# Patient Record
Sex: Female | Born: 1945
Health system: Southern US, Community
[De-identification: ages and names within clinical notes are randomized; demographics above are authoritative.]

## PROBLEM LIST (undated history)

## (undated) DIAGNOSIS — E782 Mixed hyperlipidemia: Secondary | ICD-10-CM

## (undated) DIAGNOSIS — I10 Essential (primary) hypertension: Secondary | ICD-10-CM

## (undated) DIAGNOSIS — I059 Rheumatic mitral valve disease, unspecified: Secondary | ICD-10-CM

## (undated) DIAGNOSIS — F411 Generalized anxiety disorder: Secondary | ICD-10-CM

## (undated) DIAGNOSIS — R079 Chest pain, unspecified: Secondary | ICD-10-CM

## (undated) HISTORY — DX: Mixed hyperlipidemia: E78.2

## (undated) HISTORY — PX: SPINE SURGERY: SHX786

## (undated) HISTORY — DX: Generalized anxiety disorder: F41.1

## (undated) HISTORY — PX: APPENDECTOMY: SHX54

## (undated) HISTORY — DX: Chest pain, unspecified: R07.9

## (undated) HISTORY — PX: TUBAL LIGATION: SHX77

## (undated) HISTORY — DX: Rheumatic mitral valve disease, unspecified: I05.9

## (undated) HISTORY — DX: Essential (primary) hypertension: I10

## (undated) HISTORY — PX: CARDIAC CATHETERIZATION: SHX172

---

## 1997-09-09 ENCOUNTER — Ambulatory Visit: Admission: RE | Admit: 1997-09-09 | Discharge: 1997-09-09 | Payer: Self-pay | Admitting: Cardiology

## 1998-01-30 ENCOUNTER — Other Ambulatory Visit: Admission: RE | Admit: 1998-01-30 | Discharge: 1998-01-30 | Payer: Self-pay | Admitting: Obstetrics & Gynecology

## 1998-08-24 ENCOUNTER — Ambulatory Visit (HOSPITAL_COMMUNITY): Admission: RE | Admit: 1998-08-24 | Discharge: 1998-08-24 | Payer: Self-pay | Admitting: Obstetrics and Gynecology

## 1998-08-24 ENCOUNTER — Encounter (INDEPENDENT_AMBULATORY_CARE_PROVIDER_SITE_OTHER): Payer: Self-pay | Admitting: Specialist

## 2000-05-26 ENCOUNTER — Other Ambulatory Visit: Admission: RE | Admit: 2000-05-26 | Discharge: 2000-05-26 | Payer: Self-pay | Admitting: Obstetrics and Gynecology

## 2002-07-07 ENCOUNTER — Other Ambulatory Visit: Admission: RE | Admit: 2002-07-07 | Discharge: 2002-07-07 | Payer: Self-pay | Admitting: Obstetrics and Gynecology

## 2003-01-28 ENCOUNTER — Encounter (INDEPENDENT_AMBULATORY_CARE_PROVIDER_SITE_OTHER): Payer: Self-pay | Admitting: *Deleted

## 2003-01-28 ENCOUNTER — Ambulatory Visit (HOSPITAL_COMMUNITY): Admission: RE | Admit: 2003-01-28 | Discharge: 2003-01-28 | Payer: Self-pay | Admitting: Obstetrics and Gynecology

## 2003-08-17 ENCOUNTER — Ambulatory Visit (HOSPITAL_COMMUNITY): Admission: RE | Admit: 2003-08-17 | Discharge: 2003-08-17 | Payer: Self-pay | Admitting: Cardiology

## 2003-09-10 ENCOUNTER — Encounter (INDEPENDENT_AMBULATORY_CARE_PROVIDER_SITE_OTHER): Payer: Self-pay | Admitting: Specialist

## 2003-09-10 ENCOUNTER — Inpatient Hospital Stay (HOSPITAL_COMMUNITY): Admission: EM | Admit: 2003-09-10 | Discharge: 2003-09-12 | Payer: Self-pay | Admitting: Emergency Medicine

## 2004-08-15 ENCOUNTER — Encounter: Admission: RE | Admit: 2004-08-15 | Discharge: 2004-08-15 | Payer: Self-pay | Admitting: Endocrinology

## 2004-11-16 IMAGING — CT CT PELVIS W/O CM
1 series · 15 of 32 positions shown, 19 images · non-contrast
Comparison: none

CLINICAL DATA: Right lower quadrant pain.  Question appendicitis.
CT OF THE ABDOMEN AND PELVIS WITHOUT CONTRAST
The present examination was performed on an emergent basis without intravenous or oral contrast.  No comparisons.
CT ABDOMEN:

[Series 2: abd pelvis · axial · 0.85mm/px · z∈[-466,-81]mm · 15 of 86 slices shown, 19 images]
[im 6/86  soft-tissue]
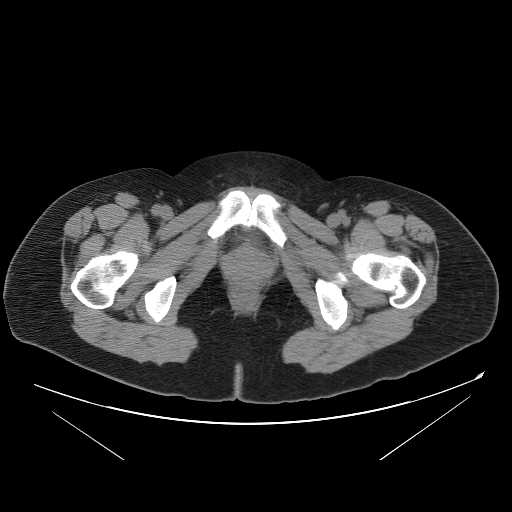
[im 6/86  bone]
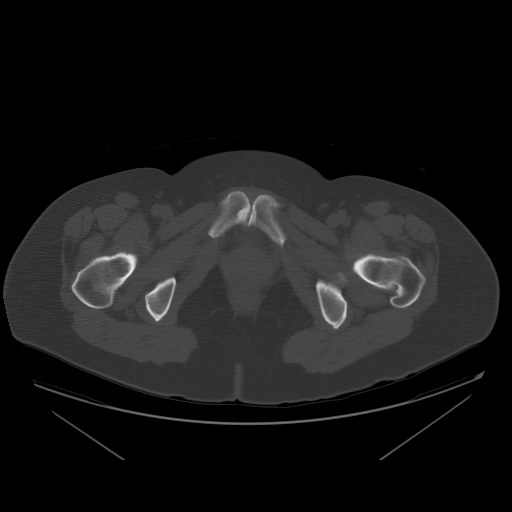
[im 11/86  soft-tissue]
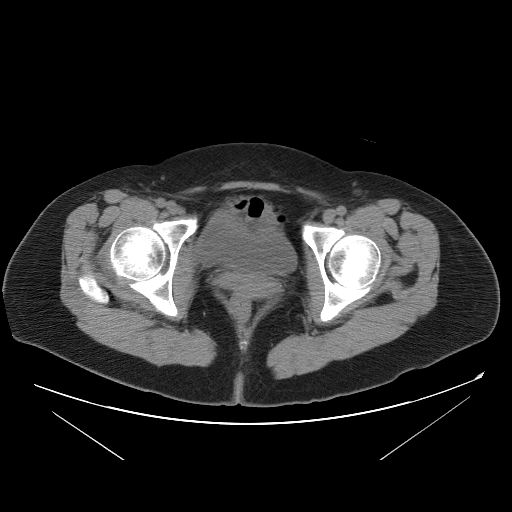
[im 17/86  soft-tissue]
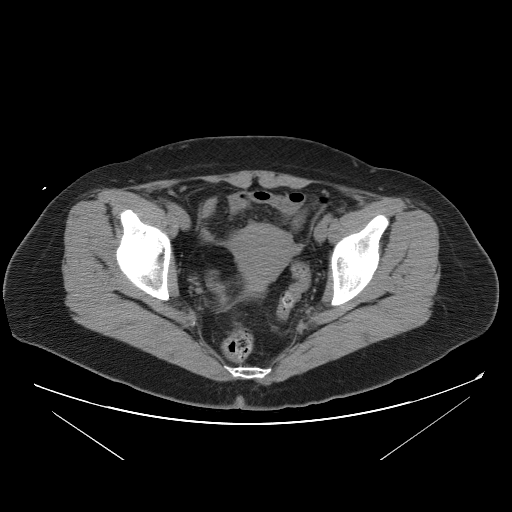
[im 25/86  soft-tissue]
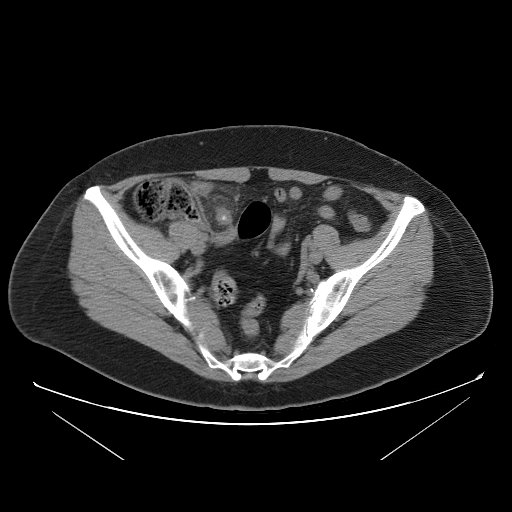
[im 31/86  soft-tissue]
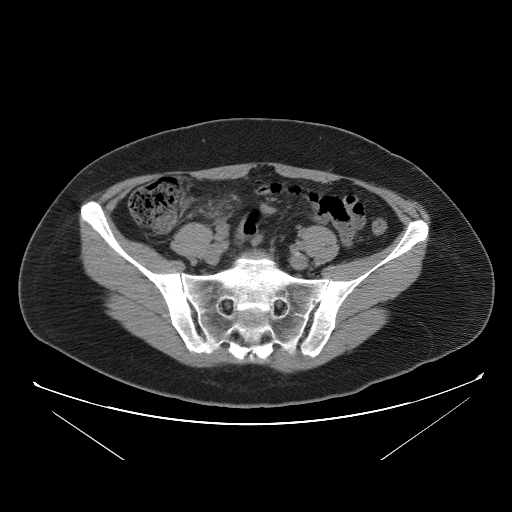
[im 36/86  soft-tissue]
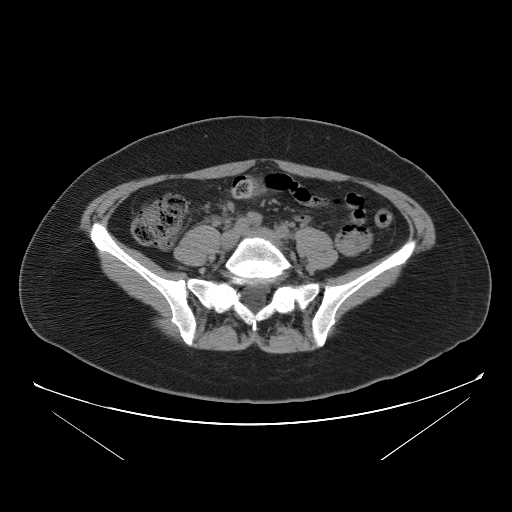
[im 44/86  soft-tissue]
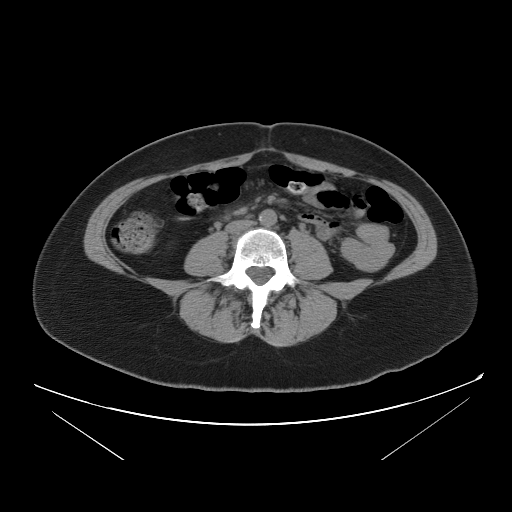
[im 50/86  soft-tissue]
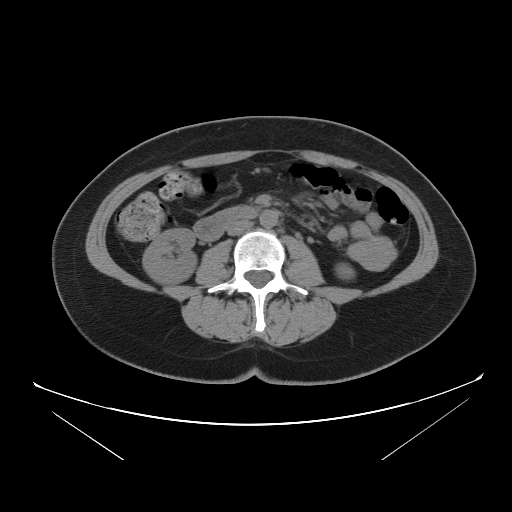
[im 55/86  soft-tissue]
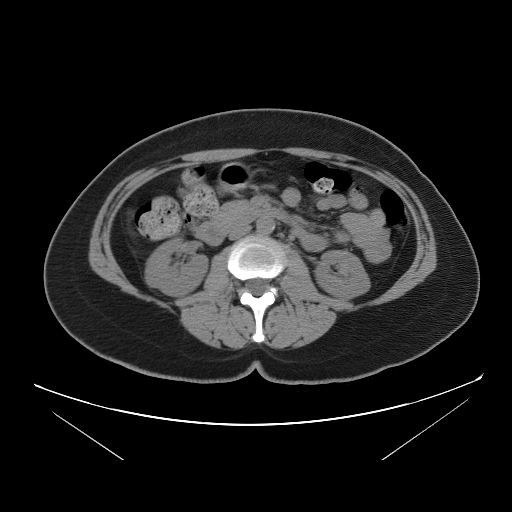
[im 55/86  bone]
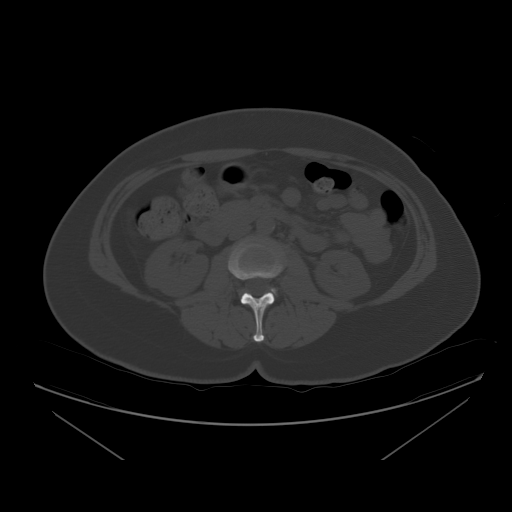
[im 61/86  soft-tissue]
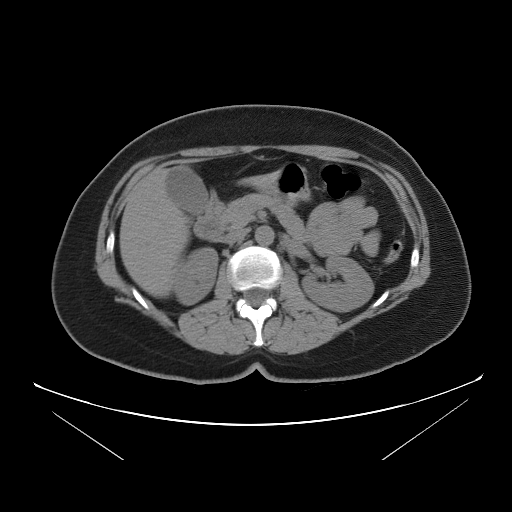
[im 69/86  soft-tissue]
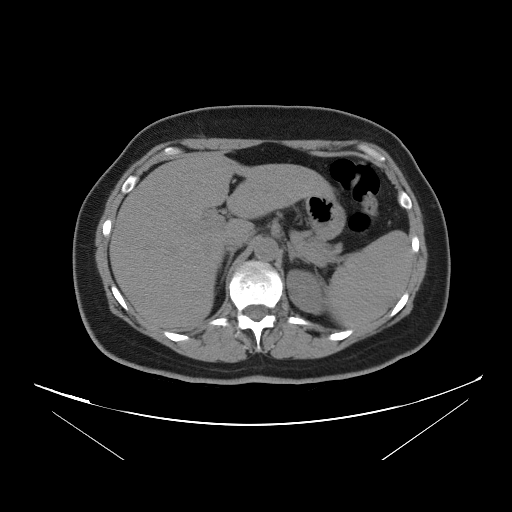
[im 75/86  soft-tissue]
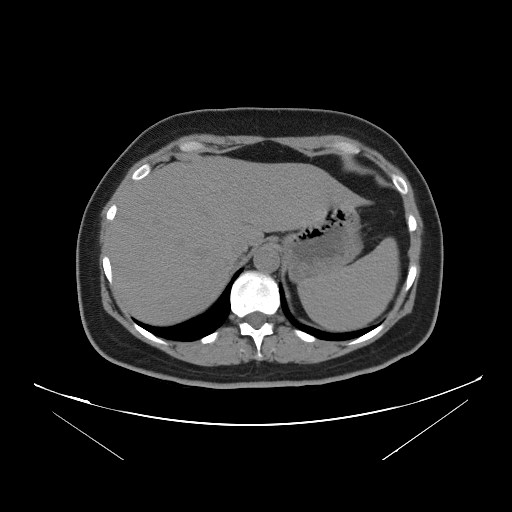
[im 75/86  lung]
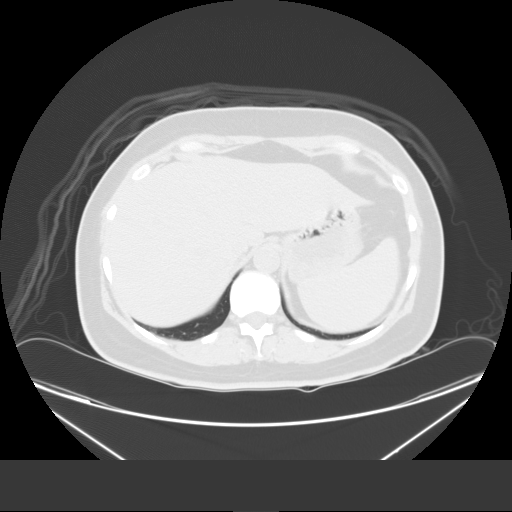
[im 77/86  lung]
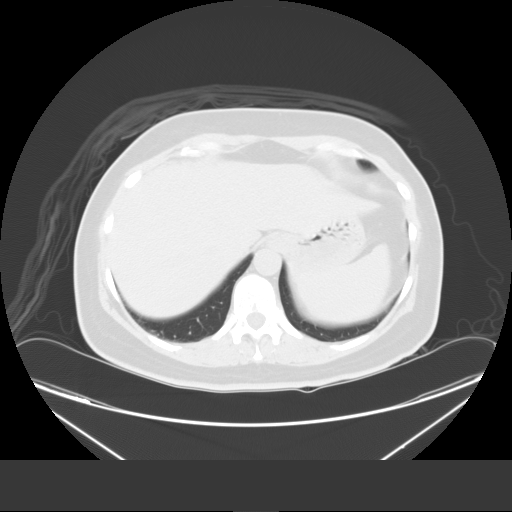
[im 80/86  soft-tissue]
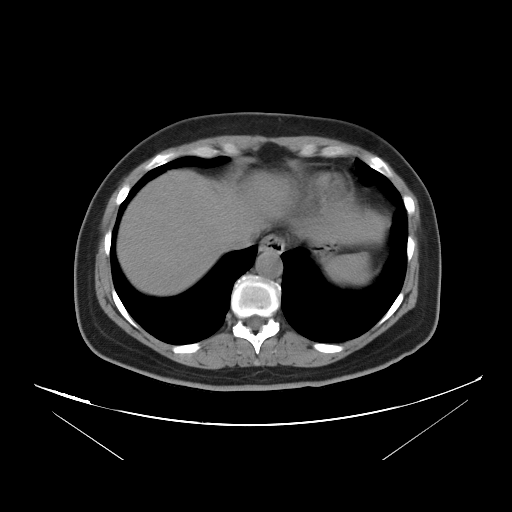
[im 80/86  lung]
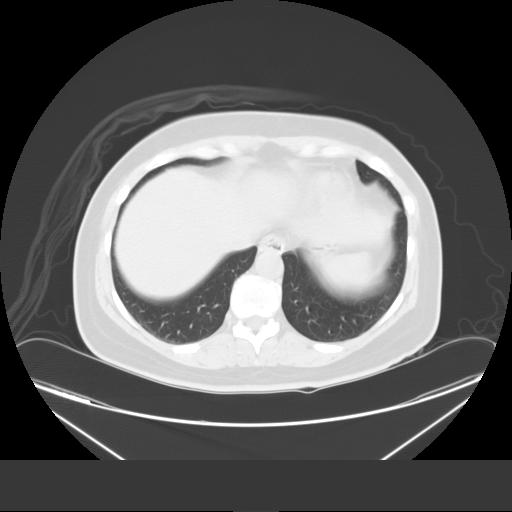
[im 83/86  lung]
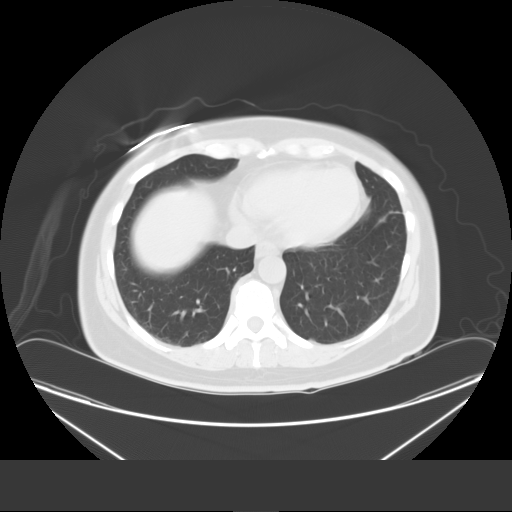

[15 of 32 positions shown; findings below may reference images not displayed]

FINDINGS: The lung bases are clear.  Unenhanced visualized portions of the liver, spleen, kidneys, pancreas, and adrenal glands are unremarkable.  There are mild atherosclerotic type changes of the abdominal aorta with calcifications but without aneurysmal dilatation.  The gallbladder by CT appears unremarkable.  No evidence of upper abdominal abnormal fluid collection or inflammatory process.  Please see below.
IMPRESSION
Mild atherosclerotic type changes of the abdominal aorta without aneurysmal dilatation.
CT PELVIS:
FINDINGS: Enlarged appendix containing an appendicolith with inflammation of surrounding  fat planes is consistent with appendicitis located just posterior to the right rectus muscle.  Degenerative changes of the lower lumbar spine.
IMPRESSION
Findings consistent with appendicitis with appendicolith.

## 2005-08-07 ENCOUNTER — Observation Stay (HOSPITAL_COMMUNITY): Admission: RE | Admit: 2005-08-07 | Discharge: 2005-08-08 | Payer: Self-pay | Admitting: Neurosurgery

## 2005-12-10 ENCOUNTER — Other Ambulatory Visit: Admission: RE | Admit: 2005-12-10 | Discharge: 2005-12-10 | Payer: Self-pay | Admitting: Obstetrics and Gynecology

## 2006-06-03 ENCOUNTER — Encounter: Admission: RE | Admit: 2006-06-03 | Discharge: 2006-06-03 | Payer: Self-pay | Admitting: Endocrinology

## 2007-10-08 ENCOUNTER — Ambulatory Visit (HOSPITAL_COMMUNITY): Admission: RE | Admit: 2007-10-08 | Discharge: 2007-10-08 | Payer: Self-pay | Admitting: Obstetrics and Gynecology

## 2008-01-08 ENCOUNTER — Ambulatory Visit (HOSPITAL_COMMUNITY): Admission: RE | Admit: 2008-01-08 | Discharge: 2008-01-08 | Payer: Self-pay | Admitting: *Deleted

## 2008-07-01 ENCOUNTER — Inpatient Hospital Stay (HOSPITAL_COMMUNITY): Admission: EM | Admit: 2008-07-01 | Discharge: 2008-07-02 | Payer: Self-pay | Admitting: Emergency Medicine

## 2008-07-01 ENCOUNTER — Ambulatory Visit: Payer: Self-pay | Admitting: Cardiovascular Disease

## 2008-07-06 DIAGNOSIS — I059 Rheumatic mitral valve disease, unspecified: Secondary | ICD-10-CM | POA: Insufficient documentation

## 2008-07-06 DIAGNOSIS — I10 Essential (primary) hypertension: Secondary | ICD-10-CM | POA: Insufficient documentation

## 2008-07-06 DIAGNOSIS — R079 Chest pain, unspecified: Secondary | ICD-10-CM | POA: Insufficient documentation

## 2008-07-06 DIAGNOSIS — E782 Mixed hyperlipidemia: Secondary | ICD-10-CM | POA: Insufficient documentation

## 2008-07-06 DIAGNOSIS — F411 Generalized anxiety disorder: Secondary | ICD-10-CM | POA: Insufficient documentation

## 2008-07-07 ENCOUNTER — Ambulatory Visit: Payer: Self-pay | Admitting: Cardiovascular Disease

## 2008-07-14 ENCOUNTER — Ambulatory Visit: Payer: Self-pay | Admitting: Cardiology

## 2008-07-14 ENCOUNTER — Inpatient Hospital Stay (HOSPITAL_COMMUNITY): Admission: EM | Admit: 2008-07-14 | Discharge: 2008-07-16 | Payer: Self-pay | Admitting: Emergency Medicine

## 2008-07-15 ENCOUNTER — Encounter: Payer: Self-pay | Admitting: Cardiology

## 2010-04-02 ENCOUNTER — Encounter: Payer: Self-pay | Admitting: Emergency Medicine

## 2010-04-10 NOTE — Letter (Signed)
Summary: [facility]  [facility]   Imported By: Kassie Mends 10/10/2008 08:42:48  _____________________________________________________________________  External Attachment:    Type:   Image     Comment:   External Document

## 2010-04-10 NOTE — Assessment & Plan Note (Signed)
Summary: [facility]   CC:  pt complians of chest pains which radiated from chest to back to jaw pt states when she tryes to caught her breath it hurts more.  History of Present Illness: [name] is seen today at the request of [facility] and Dr. Juleen China he was seen April 14 in the ER and stayed overnight for chest pain.  Had 2 previous episodes in 2006 and 2008.  She gets a spasm in the epigastric and chest area.  It radiates up towards her jaw.  It can last 15-20 minutes.  The most recent episode lasted one to 2 hours.  She was under a lot of stress at work.  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  I reviewed her hospital records.  She ruled out for myocardial infarction and no abnormalities on telemetry.  Blood work was normal and EKG was normal.  She was discharged with cardiology followup.  She has not had any recurrent chest pain.  She has previously been seen by Dr. Aggie Cosier.  She has not had any neck or stress test in quite some time.  She has a long-standing history of murmur and MVP.  He's not had any significant palpitations PND or thought me.  He does get dyspnea going upstairs but not on flat ground.  She has had some lower extremity cramping and some pain in her arches but this does not sound like claudication.  She is a headaches and palpitations syncope or diaphoresis.  Current Problems (verified): 1)  Essential Hypertension, Benign  (ICD-401.1) 2)  Mixed Hyperlipidemia  (ICD-272.2) 3)  Anxiety State, Unspecified  (ICD-300.00) 4)  Mitral Valve Prolapse  (ICD-424.0) 5)  Chest Pain Unspecified  (ICD-786.50)  Current Medications (verified): 1)  Omega 3 .Marland Kitchen.. 1 Tab By Mouth Once Daily 2)  Vitamin C 1000mg  .... 1 Tab By Mouth Once Daily 3)  Centrum Silver .Marland Kitchen.. 1 Tab By Mouth Once Daily 4)  Ativan .... As Needed 5)  Aspirin 81 Mg Tbec (Aspirin) .... Take One Tablet By Mouth Daily 6)  Exforge 5mg -160mg  .... Take One Tablet By Mouth  Daily 7)  Metoprolol Succinate 50 Mg Xr24h-Tab (Metoprolol Succinate) .... 1/2 Tab By Mouth Once Daily 8)  Crestor 20 Mg Tabs (Rosuvastatin Calcium) .... 1/2 Tab Po Once Daily  Allergies (verified): 1)  ! * Mycin 2)  ! Codeine  Past History:  Past Medical History:     1. Hypertension.      2. Hyperlipidemia.      3. Mitral valve prolapse.      4. History of bilateral tubal ligation.      5. History of acute appendicitis treated with laparoscopic          appendectomy.      6. History of cervical spine surgery in 2007.     7. Chest Pain 4/23 [facility] admission R/O outpatient stress echo (07/06/2008)  Family History:     Mother died in his 87s of coronary artery disease.      Father died in his 33s of an MI.  Brother died in his late 37s after      having had aortic valve replacement, bypass surgery and a pacemaker      placed.  (07/06/2008)  Social History:     [place].      She is employed with a [employer] in [place].  She is a  nonsmoker, nondrinker and has moderate to heavy caffeine intake in the      form of sodas.  (07/06/2008)  Review of Systems       Denies fever, malais, weight loss, blurry vision, decreased visual acuity, cough, sputum, SOB, hemoptysis, pleuritic pain, palpitaitons, heartburn, abdominal pain, melena, lower extremity edema, claudication, or rash. Mild pain in arches  Vital Signs:  Patient profile:   65 year old female Weight:      175 pounds Pulse rate:   55 / minute Resp:     12 per minute BP sitting:   133 / 80  (left arm)  Vitals Entered By: Kem Parkinson (July 07, 2008 2:23 PM)  Physical Exam  General:  Affect appropriate Healthy:  appears stated age HEENT: normal Neck supple with no adenopathy JVP normal no bruits no thyromegaly Lungs clear with no wheezing and good diaphragmatic motion Heart:  S1/S2 soft SEM murmur,rub, gallop or click PMI normal Abdomen: benighn, BS  positve, no tenderness, no AAA no bruit.  No HSM or HJR Distal pulses intact with no bruits No edema Neuro non-focal Skin warm and dry    Impression & Recommendations:  Problem # 1:  CHEST PAIN UNSPECIFIED (ICD-786.50) More atypical.  ECG normal F/U stress echo Her updated medication list for this problem includes:    Aspirin 81 Mg Tbec (Aspirin) .Marland Kitchen... Take one tablet by mouth daily    Metoprolol Succinate 50 Mg Xr24h-tab (Metoprolol succinate) .Marland Kitchen... 1/2 tab by mouth once daily  Orders: Stress Echo (Stress Echo)  Problem # 2:  MITRAL VALVE PROLAPSE (ICD-424.0) Murmur sounds like aortic sclerosis.  Will see valves with stress echo.  Doubt significant MVP No need for SBE prophylaixis Her updated medication list for this problem includes:    Metoprolol Succinate 50 Mg Xr24h-tab (Metoprolol succinate) .Marland Kitchen... 1/2 tab by mouth once daily  Orders: EKG w/ Interpretation (93000)  Problem # 3:  ESSENTIAL HYPERTENSION, BENIGN (ICD-401.1) Well controlled low sodium diet Her updated medication list for this problem includes:    Aspirin 81 Mg Tbec (Aspirin) .Marland Kitchen... Take one tablet by mouth daily    Metoprolol Succinate 50 Mg Xr24h-tab (Metoprolol succinate) .Marland Kitchen... 1/2 tab by mouth once daily  Problem # 4:  MIXED HYPERLIPIDEMIA (ICD-272.2) Continue statin lipid and liver per Dr/ Juleen China Her updated medication list for this problem includes:    Crestor 20 Mg Tabs (Rosuvastatin calcium) .Marland Kitchen... 1/2 tab po once daily  Patient Instructions: 1)  Your physician recommends that you schedule a follow-up appointment in: as needed 2)  Your physician has requested that you have a stress echocardiogram. For further information please visit [email or web address]  Please follow instruction sheet as given.

## 2010-05-01 ENCOUNTER — Emergency Department (HOSPITAL_COMMUNITY)
Admission: EM | Admit: 2010-05-01 | Discharge: 2010-05-02 | Disposition: A | Discharge status: Home / Self Care | Payer: BC Managed Care – PPO | Attending: Emergency Medicine | Admitting: Emergency Medicine

## 2010-05-01 ENCOUNTER — Emergency Department (HOSPITAL_COMMUNITY): Payer: BC Managed Care – PPO

## 2010-05-01 DIAGNOSIS — R0789 Other chest pain: Secondary | ICD-10-CM | POA: Insufficient documentation

## 2010-05-01 DIAGNOSIS — Z981 Arthrodesis status: Secondary | ICD-10-CM | POA: Insufficient documentation

## 2010-05-01 DIAGNOSIS — I498 Other specified cardiac arrhythmias: Secondary | ICD-10-CM | POA: Insufficient documentation

## 2010-05-01 DIAGNOSIS — M47812 Spondylosis without myelopathy or radiculopathy, cervical region: Secondary | ICD-10-CM | POA: Insufficient documentation

## 2010-05-01 DIAGNOSIS — E7800 Pure hypercholesterolemia, unspecified: Secondary | ICD-10-CM | POA: Insufficient documentation

## 2010-05-01 DIAGNOSIS — M542 Cervicalgia: Secondary | ICD-10-CM | POA: Insufficient documentation

## 2010-05-01 DIAGNOSIS — I059 Rheumatic mitral valve disease, unspecified: Secondary | ICD-10-CM | POA: Insufficient documentation

## 2010-05-01 LAB — DIFFERENTIAL
Basophils Absolute: 0 10*3/uL (ref 0.0–0.1)
Basophils Relative: 0 % (ref 0–1)
Eosinophils Absolute: 0.3 10*3/uL (ref 0.0–0.7)
Eosinophils Relative: 3 % (ref 0–5)
Lymphocytes Relative: 28 % (ref 12–46)
Lymphs Abs: 2.7 10*3/uL (ref 0.7–4.0)
Monocytes Absolute: 0.6 10*3/uL (ref 0.1–1.0)
Monocytes Relative: 7 % (ref 3–12)
Neutro Abs: 5.8 10*3/uL (ref 1.7–7.7)
Neutrophils Relative %: 62 % (ref 43–77)

## 2010-05-01 LAB — COMPREHENSIVE METABOLIC PANEL
ALT: 17 U/L (ref 0–35)
AST: 23 U/L (ref 0–37)
Albumin: 4.7 g/dL (ref 3.5–5.2)
Alkaline Phosphatase: 81 U/L (ref 39–117)
BUN: 8 mg/dL (ref 6–23)
CO2: 30 mEq/L (ref 19–32)
Calcium: 9.7 mg/dL (ref 8.4–10.5)
Chloride: 107 mEq/L (ref 96–112)
Creatinine, Ser: 0.71 mg/dL (ref 0.4–1.2)
GFR calc Af Amer: >60 mL/min (ref >60)
GFR calc non Af Amer: >60 mL/min (ref >60)
Glucose, Bld: 94 mg/dL (ref 70–99)
Potassium: 3.6 mEq/L (ref 3.5–5.1)
Sodium: 146 mEq/L — ABNORMAL HIGH (ref 135–145)
Total Bilirubin: 0.6 mg/dL (ref 0.3–1.2)
Total Protein: 7.5 g/dL (ref 6.0–8.3)

## 2010-05-01 LAB — CBC
HCT: 42.4 % (ref 36.0–46.0)
Hemoglobin: 14.4 g/dL (ref 12.0–15.0)
MCH: 30.2 pg (ref 26.0–34.0)
MCHC: 34 g/dL (ref 30.0–36.0)
MCV: 88.9 fL (ref 78.0–100.0)
Platelets: 223 10*3/uL (ref 150–400)
RBC: 4.77 MIL/uL (ref 3.87–5.11)
RDW: 13 % (ref 11.5–15.5)
WBC: 9.4 10*3/uL (ref 4.0–10.5)

## 2010-05-01 LAB — POCT CARDIAC MARKERS
CKMB, poc: 1.2 ng/mL (ref 1.0–8.0)
CKMB, poc: <1 ng/mL — ABNORMAL LOW (ref 1.0–8.0)
Myoglobin, poc: 59.2 ng/mL (ref 12–200)
Myoglobin, poc: 72.8 ng/mL (ref 12–200)
Troponin i, poc: 0.09 ng/mL (ref 0.00–0.09)
Troponin i, poc: <0.05 ng/mL (ref 0.00–0.09)

## 2010-06-19 LAB — COMPREHENSIVE METABOLIC PANEL
ALT: 24 U/L (ref 0–35)
AST: 29 U/L (ref 0–37)
Albumin: 4.3 g/dL (ref 3.5–5.2)
Alkaline Phosphatase: 95 U/L (ref 39–117)
BUN: 10 mg/dL (ref 6–23)
CO2: 25 mEq/L (ref 19–32)
Calcium: 9.3 mg/dL (ref 8.4–10.5)
Chloride: 105 mEq/L (ref 96–112)
Creatinine, Ser: 0.56 mg/dL (ref 0.4–1.2)
GFR calc Af Amer: >60 mL/min (ref >60)
GFR calc non Af Amer: >60 mL/min (ref >60)
Glucose, Bld: 89 mg/dL (ref 70–99)
Potassium: 4 mEq/L (ref 3.5–5.1)
Sodium: 141 mEq/L (ref 135–145)
Total Bilirubin: 0.7 mg/dL (ref 0.3–1.2)
Total Protein: 7.4 g/dL (ref 6.0–8.3)

## 2010-06-19 LAB — CBC
HCT: 38.1 % (ref 36.0–46.0)
HCT: 44.4 % (ref 36.0–46.0)
Hemoglobin: 13.1 g/dL (ref 12.0–15.0)
Hemoglobin: 15.2 g/dL — ABNORMAL HIGH (ref 12.0–15.0)
MCHC: 34.3 g/dL (ref 30.0–36.0)
MCHC: 34.4 g/dL (ref 30.0–36.0)
MCV: 90.2 fL (ref 78.0–100.0)
MCV: 90.4 fL (ref 78.0–100.0)
Platelets: 209 K/uL (ref 150–400)
Platelets: 220 10*3/uL (ref 150–400)
RBC: 4.23 MIL/uL (ref 3.87–5.11)
RBC: 4.91 MIL/uL (ref 3.87–5.11)
RDW: 12.9 % (ref 11.5–15.5)
RDW: 13.2 % (ref 11.5–15.5)
WBC: 5.9 K/uL (ref 4.0–10.5)
WBC: 7.6 10*3/uL (ref 4.0–10.5)

## 2010-06-19 LAB — POCT CARDIAC MARKERS
CKMB, poc: 1.6 ng/mL (ref 1.0–8.0)
CKMB, poc: 1.7 ng/mL (ref 1.0–8.0)
Myoglobin, poc: 76.9 ng/mL (ref 12–200)
Myoglobin, poc: 88.1 ng/mL (ref 12–200)
Troponin i, poc: <0.05 ng/mL (ref 0.00–0.09)
Troponin i, poc: <0.05 ng/mL (ref 0.00–0.09)

## 2010-06-19 LAB — BASIC METABOLIC PANEL WITH GFR
BUN: 12 mg/dL (ref 6–23)
CO2: 27 meq/L (ref 19–32)
Calcium: 8.8 mg/dL (ref 8.4–10.5)
Chloride: 108 meq/L (ref 96–112)
Creatinine, Ser: 0.7 mg/dL (ref 0.4–1.2)
GFR calc non Af Amer: >60 mL/min
Glucose, Bld: 92 mg/dL (ref 70–99)
Potassium: 3.8 meq/L (ref 3.5–5.1)
Sodium: 143 meq/L (ref 135–145)

## 2010-06-19 LAB — DIFFERENTIAL
Basophils Absolute: 0 10*3/uL (ref 0.0–0.1)
Basophils Relative: 0 % (ref 0–1)
Eosinophils Absolute: 0.3 10*3/uL (ref 0.0–0.7)
Eosinophils Relative: 4 % (ref 0–5)
Lymphocytes Relative: 25 % (ref 12–46)
Lymphs Abs: 1.9 10*3/uL (ref 0.7–4.0)
Monocytes Absolute: 0.5 10*3/uL (ref 0.1–1.0)
Monocytes Relative: 6 % (ref 3–12)
Neutro Abs: 4.9 10*3/uL (ref 1.7–7.7)
Neutrophils Relative %: 65 % (ref 43–77)

## 2010-06-19 LAB — POCT I-STAT, CHEM 8
BUN: 10 mg/dL (ref 6–23)
Calcium, Ion: 1.1 mmol/L — ABNORMAL LOW (ref 1.12–1.32)
Chloride: 106 mEq/L (ref 96–112)
Creatinine, Ser: 0.9 mg/dL (ref 0.4–1.2)
Glucose, Bld: 87 mg/dL (ref 70–99)
HCT: 45 % (ref 36.0–46.0)
Hemoglobin: 15.3 g/dL — ABNORMAL HIGH (ref 12.0–15.0)
Potassium: 3.8 mEq/L (ref 3.5–5.1)
Sodium: 142 mEq/L (ref 135–145)
TCO2: 26 mmol/L (ref 0–100)

## 2010-06-19 LAB — PROTIME-INR
INR: 0.9 (ref 0.00–1.49)
Prothrombin Time: 12.4 seconds (ref 11.6–15.2)

## 2010-06-19 LAB — D-DIMER, QUANTITATIVE

## 2010-06-19 LAB — APTT: aPTT: 28 seconds (ref 24–37)

## 2010-06-20 LAB — COMPREHENSIVE METABOLIC PANEL
ALT: 21 U/L (ref 0–35)
AST: 24 U/L (ref 0–37)
Albumin: 4.1 g/dL (ref 3.5–5.2)
Alkaline Phosphatase: 80 U/L (ref 39–117)
BUN: 10 mg/dL (ref 6–23)
CO2: 27 mEq/L (ref 19–32)
Calcium: 9.2 mg/dL (ref 8.4–10.5)
Chloride: 109 mEq/L (ref 96–112)
Creatinine, Ser: 0.63 mg/dL (ref 0.4–1.2)
GFR calc Af Amer: >60 mL/min (ref >60)
GFR calc non Af Amer: >60 mL/min (ref >60)
Glucose, Bld: 107 mg/dL — ABNORMAL HIGH (ref 70–99)
Potassium: 3.4 mEq/L — ABNORMAL LOW (ref 3.5–5.1)
Sodium: 142 mEq/L (ref 135–145)
Total Bilirubin: 0.7 mg/dL (ref 0.3–1.2)
Total Protein: 6.4 g/dL (ref 6.0–8.3)

## 2010-06-20 LAB — HEMOGLOBIN A1C
Hgb A1c MFr Bld: 5.5 % (ref 4.6–6.1)
Mean Plasma Glucose: 111 mg/dL

## 2010-06-20 LAB — LIPID PANEL
Cholesterol: 149 mg/dL (ref 0–200)
HDL: 40 mg/dL (ref >39)
LDL Cholesterol: 86 mg/dL (ref 0–99)
Total CHOL/HDL Ratio: 3.7 RATIO
Triglycerides: 117 mg/dL (ref <150)
VLDL: 23 mg/dL (ref 0–40)

## 2010-06-20 LAB — DIFFERENTIAL
Basophils Absolute: 0.1 10*3/uL (ref 0.0–0.1)
Basophils Relative: 1 % (ref 0–1)
Eosinophils Absolute: 0.3 10*3/uL (ref 0.0–0.7)
Eosinophils Relative: 4 % (ref 0–5)
Lymphocytes Relative: 27 % (ref 12–46)
Lymphs Abs: 2.1 10*3/uL (ref 0.7–4.0)
Monocytes Absolute: 0.4 10*3/uL (ref 0.1–1.0)
Monocytes Relative: 5 % (ref 3–12)
Neutro Abs: 4.8 10*3/uL (ref 1.7–7.7)
Neutrophils Relative %: 63 % (ref 43–77)

## 2010-06-20 LAB — CBC
HCT: 39.8 % (ref 36.0–46.0)
HCT: 40.3 % (ref 36.0–46.0)
Hemoglobin: 13.8 g/dL (ref 12.0–15.0)
Hemoglobin: 13.8 g/dL (ref 12.0–15.0)
MCHC: 34.4 g/dL (ref 30.0–36.0)
MCHC: 34.7 g/dL (ref 30.0–36.0)
MCV: 89.9 fL (ref 78.0–100.0)
MCV: 90.3 fL (ref 78.0–100.0)
Platelets: 194 10*3/uL (ref 150–400)
Platelets: 201 10*3/uL (ref 150–400)
RBC: 4.42 MIL/uL (ref 3.87–5.11)
RBC: 4.46 MIL/uL (ref 3.87–5.11)
RDW: 13.1 % (ref 11.5–15.5)
RDW: 13.3 % (ref 11.5–15.5)
WBC: 7.4 10*3/uL (ref 4.0–10.5)
WBC: 7.6 10*3/uL (ref 4.0–10.5)

## 2010-06-20 LAB — POCT CARDIAC MARKERS
CKMB, poc: 1.2 ng/mL (ref 1.0–8.0)
CKMB, poc: 1.4 ng/mL (ref 1.0–8.0)
Myoglobin, poc: 62.3 ng/mL (ref 12–200)
Myoglobin, poc: 67.8 ng/mL (ref 12–200)
Troponin i, poc: <0.05 ng/mL (ref 0.00–0.09)
Troponin i, poc: <0.05 ng/mL (ref 0.00–0.09)

## 2010-06-20 LAB — POCT I-STAT, CHEM 8
BUN: 10 mg/dL (ref 6–23)
Calcium, Ion: 1.16 mmol/L (ref 1.12–1.32)
Chloride: 106 mEq/L (ref 96–112)
Creatinine, Ser: 0.8 mg/dL (ref 0.4–1.2)
Glucose, Bld: 103 mg/dL — ABNORMAL HIGH (ref 70–99)
HCT: 40 % (ref 36.0–46.0)
Hemoglobin: 13.6 g/dL (ref 12.0–15.0)
Potassium: 3.3 mEq/L — ABNORMAL LOW (ref 3.5–5.1)
Sodium: 142 mEq/L (ref 135–145)
TCO2: 26 mmol/L (ref 0–100)

## 2010-06-20 LAB — CARDIAC PANEL(CRET KIN+CKTOT+MB+TROPI)
CK, MB: 1.6 ng/mL (ref 0.3–4.0)
CK, MB: 1.8 ng/mL (ref 0.3–4.0)
Relative Index: INVALID (ref 0.0–2.5)
Relative Index: INVALID (ref 0.0–2.5)
Total CK: 83 U/L (ref 7–177)
Total CK: 90 U/L (ref 7–177)
Troponin I: 0.01 ng/mL (ref 0.00–0.06)
Troponin I: 0.02 ng/mL (ref 0.00–0.06)

## 2010-06-20 LAB — TSH: TSH: 3.879 u[IU]/mL (ref 0.350–4.500)

## 2010-06-20 LAB — PROTIME-INR
INR: 0.9 (ref 0.00–1.49)
Prothrombin Time: 12.4 seconds (ref 11.6–15.2)

## 2010-06-20 LAB — APTT: aPTT: 34 seconds (ref 24–37)

## 2010-06-20 LAB — HEPARIN LEVEL (UNFRACTIONATED): Heparin Unfractionated: 0.84 IU/mL — ABNORMAL HIGH (ref 0.30–0.70)

## 2010-07-24 NOTE — H&P (Signed)
NAMERELDA, AGOSTO                  ACCOUNT NO.:  [id number]   MEDICAL RECORD NO.:  [id number]          PATIENT TYPE:  INP   LOCATION:  0102                         FACILITY:  [facility]   PHYSICIAN:  Acey Lav, MD  DATE OF BIRTH:  May 27, 1945   DATE OF ADMISSION:  07/01/2008  DATE OF DISCHARGE:                              HISTORY & PHYSICAL   CHIEF COMPLAINTS:  Chest pain.   HISTORY OF PRESENT ILLNESS:  [name] is a 65 year old Caucasian  female with a past medical history significant for mitral valve  prolapse, hypertension and hyperlipidemia with prior chest pain that was  worked up with exercise stress test and nuclear perfusion stress  testing.  Has not had chest pain for at least a year.  The patient  states that this morning while working for the [employer]  [employer], she had been working on paperwork and had just come  upstairs to her office.  She sat down and as she was reviewing the  paperwork, she developed sudden onset of 8/10 to 10/10 substernal chest  pain that she described as sharp, center of the chest, left side of the  chest radiating into her neck and to middle of her back with associated  pleurisy.  Her coworkers described her as looking pale.  She did not get  nauseous or diaphoretic.  She took at 325 mg of aspirin and within 20  minutes the chest pain eased off.  She then came to the emergency  department at [facility] where she was evaluated.  She was given  aspirin here in the emergency room and with time her chest pain went  away.  She does not recall exactly when but I believe it was about after  at least 1/2 hour to an hour after being here.  Her EKG in the emergency  department showed sinus bradycardia with no acute ST or T-wave changes  compared to her prior EKG obtained in May 2007.  Her cardiac markers  were negative.  When I examined the patient, she was chest pain free.  She stated that she has never had chest pain this severe and that  it  previously had not radiated to her neck or back and she had not had  pleurisy before.  She last had chest pain approximately a year ago at  which time she went to see an Urgent Care off of [address].  An EKG  was obtained at that time.  She did not have further stress testing done  but she did have a stress test done via Dr. Juleen China.  She was followed by  Dr. Lucas Mallow.  Dr. Lucas Mallow used to be her cardiologist but apparently has  recently retired.  She is not sure of the exact date of these tests.  She has a brother had of the first onset of coronary disease and the age  of 58 and died in his early 41s of coronary disease.  She has several  other family members with coronary disease but no others early heart  disease.  The patient is a nonsmoker.  She is not known to be diabetic.  She does have high cholesterol and is on medicine for high blood  pressure.   PAST MEDICAL HISTORY:  1. History of mitral valve prolapse.  2. Chest pain that was worked up with perfusion test previously.  3. Hypertension.  4. Hyperlipidemia.   PAST SURGICAL HISTORY:  1. She has had appendectomy.  2. She has had tubal ligation.  3. Note, also this past fall she had had some right lower quadrant      abdominal pain.  A CT of the abdomen and pelvis performed in late      July showed some edema near the cecal tip near where she had her      appendectomy.  At the time, it was thought she might have had      diverticulitis or mild colitis involving the cecum.  She had a      colonoscopy which did not disclose any diverticula but did show      some internal hemorrhoids.   FAMILY HISTORY:  As mentioned, brother died in his early 76s, having had  his first heart attack at the age of 68.  Both her parents have coronary  artery disease.  Her son, at the age of 38, went into cardiac arrest and  asystole and was resuscitated.   SOCIAL HISTORY:  Patient is nonsmoker, nondrinker.  She is married.  She  works at  [employer].   REVIEW OF SYSTEMS:  As described in the history of present illness.  Otherwise 10-point review of systems negative.   CURRENT MEDICATIONS:  1. Toprol-XL 25 mg once daily.  2. Crestor 10 mg daily.  3. Exforge 5/160 once daily.  4. Aspirin 81 mg once daily.   ALLERGIES:  No known drug allergies.   PHYSICAL EXAMINATION:  VITAL SIGNS:  Blood pressure 129/58. pulse was  59, respirations 16, pulse ox was 98-100% on 2 L via nasal cannula.  T-  max 98.2.  GENERAL:  Quite pleasant lady in no acute distress.  HEENT: Normocephalic, atraumatic.  Pupils are round, reactive to light.  Sclerae anicteric.  Oropharynx clear.  CARDIOVASCULAR:  Regular rate and  rhythm with a grade 2/6  systolic murmur at the apex.  LUNGS:  Clear to auscultation bilaterally.  ABDOMEN:  Round and soft, nondistended, nontender.  LOWER EXTREMITIES:  No edema.  NEUROLOGIC: Nonfocal.   LABORATORY DATA:  EKG as mentioned.  No acute ST-T wave changes.  Cardiac enzymes were negative.  Comprehensive metabolic panel  significant for sodium 142, potassium 3.4, chloride 109, bicarb 27, BUN  10, creatinine 0.63, glucose 107.  Transaminases normal.  CBC with  differential:  White count 7.6, hemoglobin 13.8, hematocrit 39.8,  platelets 301,000, ANC of 4.8.  Chest x-ray, two-view, no acute  cardiopulmonary disease.   ASSESSMENT/PLAN:  This is a 65 year old Caucasian female with history of  hypertension, hyperlipidemia and family history of coronary disease with  prior episodes of chest pain, most recent one occurring more than a year  ago with negative Myoview.  Now with acute onset of chest pain at rest  with radiation to her neck and back.  Also with pleurisy.  1. Acute coronary syndrome.  Given the onset of new chest pain, the      first time in a year that occurred with rest,  I will treat this as      acute coronary syndrome.  I am going to put her on intravenous  heparin.  She is  certainly beta blocked down into the 50s already.      I will put her Crestor, resume her Toprol in the morning if her      heart rate permits this.  I will cycle her cardiac enzymes, monitor      her on telemetry.  I am talking to [facility]  who are on      call tonight.  I think she should have a cardiac catheterization to      further elucidate what is going on with her heart.  2. Hyperlipidemia as mentioned.  Continue her statin.  3. Prophylaxis.  The patient is fully anticoagulated.  4. Because she is fully anticoagulated, I will give her a proton pump      inhibitor.  5. Code status.  The patient is full code.      Acey Lav, MD  Electronically Signed     CV/MEDQ  D:  07/01/2008  T:  07/02/2008  Job:  [id number]   cc:   Brooke Bonito, M.D.  Fax: [phone]

## 2010-07-24 NOTE — Discharge Summary (Signed)
[name], [name]                  ACCOUNT NO.:  [id number]   MEDICAL RECORD NO.:  [id number]          PATIENT TYPE:  INP   LOCATION:  3712                         FACILITY:  [facility]   PHYSICIAN:  Luis Abed, MD, FACCDATE OF BIRTH:  12-13-1945   DATE OF ADMISSION:  07/14/2008  DATE OF DISCHARGE:  07/16/2008                               DISCHARGE SUMMARY   PROCEDURES:  1. Cardiac catheterization.  2. Coronary arteriogram.  3. Left ventriculogram.   PRIMARY FINAL DISCHARGE DIAGNOSIS:  Chest pain, no coronary artery  disease at catheterization.   SECONDARY DIAGNOSES:  1. Hypertension.  2. Hyperlipidemia.  3. History of mitral valve prolapse.  4. Status post bilateral tubal ligation, appendicitis, and cervical-      spine surgery.   FAMILY HISTORY:  Coronary artery disease.   TIME AT DISCHARGE:  32 minutes.   HOSPITAL COURSE:  [name] is a 65 year old female with no previous  history of coronary artery disease.  She was evaluated for chest pain in  April and came back to the emergency room for recurrent chest pain on  the day of admission.   [name] cardiac enzymes were negative for MI.  A cardiac  catheterization was performed which showed no coronary artery disease  and normal left ventricular systolic function.  No further cardiac  workup was recommended.  She was held overnight and hydrated.   On Jul 16, 2008, [name]'s chest pain had resolved.  She was ambulating  without chest pain or shortness of breath and Dr. Myrtis Ser considered her  stable for discharge with primary care outpatient followup.   DISCHARGE INSTRUCTIONS:  Her activity levels to be increased gradually  with no lifting for a week and no driving for 2 days.  She is to limit  caffeine and stick to a heart-healthy diet.  She is to follow up with  Dr. Juleen China and call for an appointment.  She is to follow up with Dr.  Eden Emms as needed.   DISCHARGE MEDICATIONS:  1. Aspirin 81 mg daily.  2. Exforge  5/160 daily.  3. Metoprolol ER 25 mg a day.  4. Crestor 10 mg a day.  5. Omega-3, vitamin C, and Centrum Silver as prior to admission.  6. Ativan as prior to admission.  7. Tylenol p.r.n.      Theodore Demark, PA-C      Luis Abed, MD, [facility]  Electronically Signed    RB/MEDQ  D:  07/16/2008  T:  07/17/2008  Job:  [id number]   cc:   Brooke Bonito, M.D.

## 2010-07-24 NOTE — H&P (Signed)
NAMEENRIQUETA, [name]                  ACCOUNT NO.:  [id number]   MEDICAL RECORD NO.:  [id number]          PATIENT TYPE:  INP   LOCATION:  3712                         FACILITY:  [facility]   PHYSICIAN:  Luis Abed, MD, FACCDATE OF BIRTH:  [birth year]   DATE OF ADMISSION:  07/14/2008  DATE OF DISCHARGE:                              HISTORY & PHYSICAL   PRIMARY CARDIOLOGIST:  Theron Arista C. Eden Emms, MD, [facility]   CHIEF COMPLAINT:  Chest pain.   HISTORY OF PRESENT ILLNESS:  [name] is a 65 year old female with no  known history of CAD, but with risk factors including history of  hypertension, hyperlipidemia and multiple recent episodes of chest pain,  which have yet to be definitively worked up.  Today, she presents with  another episode of chest pain similar to previous episodes.  Today at  approximately 10 in the morning, the patient began to experience  substernal chest pain radiating to her back and then up to her jaw  approximately 8/10 in severity at its worse, lasting approximately 30  minutes with no associated symptoms.  The patient reports residual right-  sided neck tightness after resolution of pain.  Symptoms began at rest  and would spontaneously resolve.   PAST MEDICAL HISTORY:  1. Hypertension.  2. Hyperlipidemia.  3. Mitral valve prolapse.  4. History of bilateral tubal ligation.  5. History of acute appendicitis treated with laparoscopic      appendectomy.  6. History of cervical spine surgery in 2007.  7. Chest pain, April 23, [facility] admission rule out.  Outpatient stress      echo scheduled for Jul 25, 2008.   SOCIAL HISTORY:  The patient [place] in [place], [place] with her  husband.  She is employed at [employer] in [place].  She is a  nonsmoker, nondrinker and has a moderate to heavy caffeine intake in the  form of sodas.   FAMILY HISTORY:  Mother died in her 78s of coronary artery disease.  Father died in his 44s of an MI.  Brother died in his 11s  after having  had an aortic valve replacement, bypass surgery, and had the pacemaker  placed.   REVIEW OF SYSTEMS:  The patient had mild nausea and vomiting, 30 minutes  at approximately 7:30 p.m.  The patient had mild-to-moderate nausea with  1 episode of emesis at approximately 7:30 p.m.  The patient reports not  having eaten all day and the patient denies any chest pain symptoms  during this episode.  Currently, during this evaluation, the patient is  asymptomatic.  Otherwise, see HPI.  All the systems reviewed and were  negative.   MEDICATIONS:  1. Omega 3 one tab p.o. daily.  2. Vitamin C 1 g p.o. daily.  3. Centrum Silver 1 tab p.o. daily.  4. Ativan p.r.n.  5. Aspirin 81 mg p.o. daily.  6. Exforge 5/160 mg p.o. daily.  7. Metoprolol succinate 25 mg p.o. daily.  8. Crestor 10 mg p.o. daily.   ALLERGIES:  1. MYCIN.  2. CODEINE.   PHYSICAL EXAMINATION:  VITAL  SIGNS:  Temp 98.4 degrees Fahrenheit, BP  131/69, pulse 54, respiration rate 17, O2 saturation 99% on room air.  GENERAL:  The patient is alert and oriented x3 in no apparent distress,  able to speak and move easily without any respiratory distress.  HEENT:  Head is normocephalic, atraumatic.  Pupils are equal, round and  reactive to light.  Extraocular muscles are intact.  Nares are patent  without discharge.  Dentition is good.  Oropharynx is without erythema  or exudate.  NECK:  Supple without lymphadenopathy.  No JVD.  No thyromegaly.  HEART:  Rate is regular with audible S1 and S2 and 2/6 holosystolic  murmur.  Pulses are 2+ and equal in both upper and lower extremities  bilaterally.  LUNGS:  Clear to auscultation bilaterally.  SKIN:  No rashes, lesions, or petechiae.  ABDOMEN:  Soft, nontender.  Normal abdominal bowel sounds.  No rebound  or guarding.  No hepatosplenomegaly and no pulsations.  EXTREMITIES:  No clubbing, cyanosis or edema.  MUSCULOSKELETAL:  No joint deformity or effusion.  No spinal or CVA   tenderness.  NEUROLOGIC:  Cranial nerves II through XII are grossly intact.  Strength  5/5 in all extremities and axial groups, normal sensation throughout and  normal cerebellar function.   RADIOLOGY:  Chest x-ray, no active cardiopulmonary disease.   EKG shows a rhythm of sinus bradycardia with a rate of 52 bpm.  No acute  ST-T wave changes.  No significant Q waves, normal access.  No evidence  of hypertrophy.  PR is 172, QRS 82, QTc 443.  No significant changes  from prior tracing completed on July 02, 2008, except slight increase  in rate.   LABORATORY DATA:  WBC 7.6 (normal differential), HGB 15.2, HCT 44.4, PLT  count 220, pro time is 12.4, INR is 0.9, D-dimer is mildly elevated at  0.56.  Sodium is 141, potassium is 4.0, chloride is 105, CO2 of 25, BUN  10, creatinine 0.56, glucose is 89, total bilirubin is 0.7, alkaline  phosphatase is 95, AST is 29, ALT is 24, total protein is 7.4, albumin  is 4.3, calcium is 9.3.  The patient has 2 sets of negative point-of-  care markers.   ASSESSMENT AND PLAN:  This is a 65 year old female with no known history  of CAD, but with risk factors including hypertension, hyperlipidemia,  and multiple recent episodes of chest pain, which have not yet been  significantly worked up presenting with another episode of atypical  chest pain with typical features.  Our plan would  be to admit and monitor on telemetry.  The patient will cycle cardiac  markers and continue her home medications.  The patient will be seen in  the morning by Dr. Eden Emms, who will make determination as to the nature  of further cardiac workup.      Jarrett Ables, [facility]      Luis Abed, MD, [facility]  Electronically Signed    MS/MEDQ  D:  07/14/2008  T:  07/15/2008  Job:  [id number]

## 2010-07-24 NOTE — Assessment & Plan Note (Signed)
[facility]                                 ON-CALL NOTE   [name], [name]                           MRN:          [id number]  DATE:07/02/2008                            DOB:          1945-06-16    [name] 65 year old patient seen by the cardiology fellow Dr. Lalla Brothers  on July 01, 2008.  She has a history of hypertension, hyperlipidemia,  and mitral valve prolapse.  She is a previous patient of Dr. Lucas Mallow.  She  was admitted with atypical chest pain and ruled out for myocardial  infarction.  She needs a followup stress echo and to see me in our  office sometime in the next 2 weeks.     [name]. Eden Emms, MD, [facility]  Electronically Signed    PCN/MedQ  DD: 07/02/2008  DT: 07/03/2008  Job #: [id number]

## 2010-07-24 NOTE — Op Note (Signed)
NAMELASHENA, SIGNER                  ACCOUNT NO.:  [id number]   MEDICAL RECORD NO.:  [id number]          PATIENT TYPE:  AMB   LOCATION:  ENDO                         FACILITY:  [facility]   PHYSICIAN:  Georgiana Spinner, M.D.    DATE OF BIRTH:  1945-11-24   DATE OF PROCEDURE:  01/08/2008  DATE OF DISCHARGE:                               OPERATIVE REPORT   PROCEDURE:  Colonoscopy.   INDICATIONS:  Right-sided abdominal pain with abnormal CT scan.   ANESTHESIA:  Fentanyl 75 mcg, Versed 8 mg.   PROCEDURE:  With the patient mildly sedated in the left lateral  decubitus position, the Pentax videoscopic colonoscope was inserted into  the rectum, passed under direct vision with pressure applied to reach  the cecum, identified by base of cecum, ileocecal valve, and we entered  into the terminal ileum through the ileocecal valve.  All these were  photographed and appeared normal.  From this point, the colonoscope was  slowly withdrawn taking circumferential views of colonic mucosa,  stopping then only in the rectum which appeared normal on direct, showed  hemorrhoids on retroflexed view.  The endoscope was straightened and  withdrawn.  The patient's vital signs, pulse oximeter remained stable.  The patient tolerated the procedure well without apparent complications.   FINDINGS:  Internal hemorrhoids, otherwise a negative colonoscopic  examination including the terminal ileum.   PLAN:  Have the patient follow up with me in 6 weeks to decide if  further evaluation is necessary and as needed.           ______________________________  Georgiana Spinner, M.D.     GMO/MEDQ  D:  01/08/2008  T:  01/08/2008  Job:  [id number]

## 2010-07-24 NOTE — Consult Note (Signed)
NAMEICIS, BUDREAU                  ACCOUNT NO.:  [id number]   MEDICAL RECORD NO.:  [id number]          PATIENT TYPE:  INP   LOCATION:  1402                         FACILITY:  [facility]   PHYSICIAN:  Christell Faith, MD   DATE OF BIRTH:  Sep 04, 1945   DATE OF CONSULTATION:  07/02/2008  DATE OF DISCHARGE:                                 CONSULTATION   PRIMARY CARE PHYSICIAN:  Brooke Bonito, M.D.   CARDIOLOGIST:  Previously Jaclyn Prime. Lucas Mallow, M.D., but is presently  unassigned.   CHIEF COMPLAINT:  Chest pain.   HISTORY OF PRESENT ILLNESS:  This is a 65 year old white female who was  in a meeting today and developed intense retrosternal chest pain, sharp,  jolt-like radiating to the back and in the jaw.  This lasted about 20  minutes.  She was told by coworkers that she looked very pale.  This has  happened twice before in approximately the past 3 years, and she has  been evaluated with a nuclear stress test in that time span.  She is  presently pain free, resting comfortably in bed.  Of note, there was  also a component of increasing pain with inspiration, possibly  consistent with pleurisy per the admission history and physical.   PAST MEDICAL HISTORY:  1. Hypertension.  2. Hyperlipidemia.  3. Mitral valve prolapse.  4. History of bilateral tubal ligation.  5. History of acute appendicitis treated with laparoscopic      appendectomy.  6. History of cervical spine surgery in 2007.   SOCIAL HISTORY:  [place].  She is employed with a [employer] in [place].  She is a  nonsmoker, nondrinker and has moderate to heavy caffeine intake in the  form of sodas.   FAMILY HISTORY:  Mother died in his 39s of coronary artery disease.  Father died in his 43s of an MI.  Brother died in his late 39s after  having had aortic valve replacement, bypass surgery and a pacemaker  placed.   ALLERGIES:  MYCIN AND CODEINE.   MEDICATIONS:  1. Aspirin 81  mg p.o. daily.  2. Toprol XL 25 mg p.o. daily.  3. Crestor 10 mg p.o. daily.  4. Exforge 5/160 mg p.o. daily.   REVIEW OF SYSTEMS:  Positive for having had more upper respiratory tract  infections than usual this year.  She can remember having at least four  separate colds.  She is also been under a lot of stress lately related  to work.   PHYSICAL EXAMINATION:  VITAL SIGNS:  Temperature 98.2, pulse 59,  respiratory rate 14, blood pressure 129/58, saturation 98% on 2 liters.  GENERAL:  This a very pleasant, young looking white female in no acute  distress.  Pupils are round and reactive.  Sclerae are clear.  NECK:  Supple.  Mucous membranes are moist.  Dentition is good.  No  carotid bruits.  NECK:  Veins are flat.  No cervical adenopathy.  CARDIAC:  Rate is regular, rhythm is bradycardic.  No murmurs.  LUNGS:  Clear to auscultation bilaterally without wheezing or rales.  ABDOMEN:  Soft, nontender, nondistended.  EXTREMITIES:  No clubbing, cyanosis, edema.  MUSCULOSKELETAL:  No acute joint effusions or deformities.  NEUROLOGIC:  Awake, alert, oriented x3, 5/5 strength in all four  extremities.  Facial expressions are symmetric.   DIAGNOSTIC TESTS:  Chest x-ray shows no acute findings.  EKG shows sinus  bradycardia at 53 beats per minute with no ischemic changes.   LABORATORY DATA:  White blood cell 7.6, hemoglobin 13.8, platelets 201.  Sodium 142, potassium 3.4, BUN 10, creatinine 0.6, glucose 107, CK-MB  1.4, troponin undetectable.   IMPRESSION:  This is a 65 year old white female with multiple cardiac  risk factors who had a single episode of chest pain today at rest with  both typical and atypical features.   PLAN:  1. Agree with your medical management of aspirin, heparin, statin and      beta-blocker.  2. Agree with your plan to rule out myocardial infarction and assess a      fasting lipid panel.  3. Depending on how she is feeling tomorrow and pending the results of       her cardiac markers, we will then decide whether she needs to stay      for further inpatient testing.  Further plan in the morning by the      rounding doctor.  Thank you very much for this consult.      Christell Faith, MD  Electronically Signed     NDL/MEDQ  D:  07/02/2008  T:  07/02/2008  Job:  [id number]

## 2010-07-24 NOTE — Cardiovascular Report (Signed)
[name], [name]                  ACCOUNT NO.:  [id number]   MEDICAL RECORD NO.:  [id number]          PATIENT TYPE:  INP   LOCATION:  3712                         FACILITY:  [facility]   PHYSICIAN:  Verne Carrow, MDDATE OF BIRTH:  Feb 29, 1946   DATE OF PROCEDURE:  07/15/2008  DATE OF DISCHARGE:                            CARDIAC CATHETERIZATION   PRIMARY CARDIOLOGIST:  Theron Arista C. Eden Emms, MD, [facility]   PROCEDURE PERFORMED:  1. Left heart catheterization.  2. Selective coronary angiography.  3. Left ventricular angiogram.   OPERATOR:  Verne Carrow, MD   INDICATIONS:  Atypical chest pain in a 65 year old Caucasian female with  history of hypertension, hyperlipidemia, obesity, and a strong family  history of coronary artery disease.  The patient was admitted yesterday  and is ruled out for myocardial infarction with serial cardiac enzymes.   PROCEDURE IN DETAIL:  The patient was brought to the Inpatient Cardiac  Catheterization Laboratory after signing informed consent for the  procedure.  The right groin was prepped and draped in the sterile  fashion.  A 1% lidocaine was used for local anesthesia.  A 5-French  sheath was inserted into the right femoral artery without difficulty.  Standard diagnostic catheters were used to perform selective coronary  angiography.  A pigtail catheter was used to perform the left  ventricular angiogram.  There was no significant pressure gradient noted  across the aortic valve with pullback of the catheter.  The patient  tolerated the procedure well and was taken to the holding area in stable  condition.   HEMODYNAMIC FINDINGS:  Central aortic pressure 104/52.  Left ventricular  pressure 113/1.  Left ventricular end-diastolic pressure 8.   ANGIOGRAPHIC FINDINGS:  1. The left main coronary artery had no evidence of disease.  2. The left anterior descending coronary artery coursed to the apex      and is a large sized vessel.  This gives off a  very small first      diagonal, a small-to-moderate-sized second and third diagonal.      There is no angiographic evidence of disease in the left anterior      descending system.  3. The circumflex artery gives off an early bifurcating obtuse      marginal branch that is free of disease.  The AV groove circumflex      vessel was small-to-moderate in size and no evidence of disease.  4. The right coronary artery is a small-to-moderate-sized vessel that      gives off a large RV marginal branch.  There is a small posterior      descending artery.  There is no angiographic evidence of disease in      the right coronary artery system.  5. Left ventricular angiogram was performed in the RAO projection and      shows normal left ventricular systolic function with ejection      fraction of 65-70%.  There are no wall motion abnormalities noted.      There is no significant mitral regurgitation noted.   IMPRESSION:  1. Normal coronary angiography.  2. Normal  left ventricular systolic function.  3. Noncardiac chest pain.   RECOMMENDATIONS:  I do not recommend any further cardiac workup at this  time.  The patient will be kept overnight as we are finishing her  procedure at 5 p.m., and she will require 3 hours of bedrest before she  can ambulate.  She will hopefully be discharged in the morning.  Further  workup for chest pain can be continued as an outpatient.      Verne Carrow, MD  Electronically Signed     CM/MEDQ  D:  07/15/2008  T:  07/16/2008  Job:  [id number]   cc:   [name]. Eden Emms, MD, [facility]

## 2010-07-27 NOTE — Op Note (Signed)
[name], [name]                            ACCOUNT NO.:  [id number]   MEDICAL RECORD NO.:  [id number]                   PATIENT TYPE:  INP   LOCATION:  5705                                 FACILITY:  [facility]   PHYSICIAN:  Vikki Ports, M.D.         DATE OF BIRTH:  20-Apr-1945   DATE OF PROCEDURE:  09/10/2003  DATE OF DISCHARGE:                                 OPERATIVE REPORT   PREOPERATIVE DIAGNOSIS:  Acute appendicitis.   POSTOPERATIVE DIAGNOSIS:  Acute appendicitis.   OPERATION PERFORMED:  Laparoscopic appendectomy.   SURGEON:  Vikki Ports, M.D.   ANESTHESIA:  General.   DESCRIPTION OF PROCEDURE:  The patient was taken to the operating room and  placed in supine position.  After adequate  anesthesia was induced using  endotracheal tube, the abdomen was prepped and draped in the normal sterile  fashion.  Using a 12 mm Optiview trocar was placed in the left upper  quadrant under direct visualization.  Pneumoperitoneum was obtained.  A 5 mm  and an additional 11 mm trocar was placed in the left abdomen under direct  visualization.  The appendix was identified, was quite edematous with  surrounding inflammation but no evidence of perforation.  Mesoappendix was  taken down with a Harmonic scalpel.  The base of the appendix was transected  using a blue loaded Ethicon linear cutter GIA stapling device.  It was  placed in an Endocatch bag and removed through the left upper quadrant port.  Incisions were injected using Marcaine.  The skin was closed with staples.  The patient tolerated the procedure well and went to PACU in good condition.                                               Vikki Ports, M.D.    KRH/MEDQ  D:  09/10/2003  T:  09/11/2003  Job:  [id number]

## 2010-07-27 NOTE — H&P (Signed)
NAME:  [name], [name]                            ACCOUNT NO.:  [id number]   MEDICAL RECORD NO.:  [id number]                   PATIENT TYPE:  AMB   LOCATION:  DAY                                  FACILITY:  [facility]   PHYSICIAN:  Zenaida Niece, M.D.             DATE OF BIRTH:  21-Oct-1945   DATE OF ADMISSION:  01/28/2003  DATE OF DISCHARGE:                                HISTORY & PHYSICAL   CHIEF COMPLAINT:  Postmenopausal bleeding.   HISTORY OF PRESENT ILLNESS:  This is a 65 year old white female who is  multiparous whom I have followed for a period of time with abnormal  bleeding. In June of 2000, she underwent a hysteroscopy with D&C for  menometrorrhagia and a possible endometrial polyp. Pathology at that time  revealed benign endometrium without lesions. She then did well for a couple  of years. At her most recent annual exam in April of this year, she had had  bleeding only one time since starting [facility] for hormone replacement therapy  but she was having strange dreams the this. Thus she was changed to Climara  pro patch. On that she began having frequent bleeding but was otherwise  doing well. An ultrasound performed in the office revealed normal uterus,  normal endometrium and the ovaries were not seen. On saline infusion, there  was a small fundal lesion seen anteriorly. Due to this, she is admitted for  hysteroscopy with resection and D&C.   PAST MEDICAL HISTORY:  Significant for hypertension and  hypercholesterolemia.   PAST SURGICAL HISTORY:  Significant for tubal ligation and prior  hysteroscopy with resection and D&C.   ALLERGIES:  MINOCIN and CODEINE.   CURRENT MEDICATIONS:  Toprol XL and Lipitor.   SOCIAL HISTORY:  The patient is married and denies alcohol, tobacco or drug  use.   FAMILY HISTORY:  No GYN or colon cancer.   REVIEW OF SYMPTOMS:  Negative.   PHYSICAL EXAMINATION:  GENERAL:  This is a well-developed, well-nourished,  white female who is in no  acute distress.  NECK:  Supple without lymphadenopathy or thyromegaly.  LUNGS:  Clear to auscultation.  HEART:  Regular rate and rhythm without murmur.  ABDOMEN:  Soft, nontender, nondistended without hepatosplenomegaly or other  masses.  EXTREMITIES:  No edema are nontender.  PELVIC:  External genitalia is normal. On speculum exam, she has a normal  cervix and Pap smear is normal. On bimanual exam, she has a small anteverted  nontender uterus and this is confirmed by rectovaginal exam.   ASSESSMENT:  Recurrent postmenopausal bleeding with a possible fundal lesion  by ultrasound. Options have been discussed with the patient and we have  elected to proceed with surgical therapy. The risks of surgery including  bleeding, infection, damage to other organs, uterine perforation and fluid  absorption have been discussed with the patient.   PLAN:  Admit the patient on the day  of surgery for hysteroscopy with  resection of a lesion and dilation and curettage.                                               Zenaida Niece, M.D.    TDM/MEDQ  D:  01/27/2003  T:  01/27/2003  Job:  [id number]

## 2010-07-27 NOTE — Op Note (Signed)
NAME:  [name], [name]                            ACCOUNT NO.:  [id number]   MEDICAL RECORD NO.:  [id number]                   PATIENT TYPE:  AMB   LOCATION:  DAY                                  FACILITY:  [facility]   PHYSICIAN:  Zenaida Niece, M.D.             DATE OF BIRTH:  07/01/1945   DATE OF PROCEDURE:  01/28/2003  DATE OF DISCHARGE:                                 OPERATIVE REPORT   PREOPERATIVE DIAGNOSES:  Postmenopausal bleeding and endometrial lesion.   POSTOPERATIVE DIAGNOSES:  Postmenopausal bleeding and endometrial lesion.   PROCEDURE:  Hysteroscopy with resection of endometrium and endometrial  lesions and dilation and curettage.   SURGEON:  Zenaida Niece, M.D.   ANESTHESIA:  General with an LMA.   ESTIMATED BLOOD LOSS:  Less than 50 mL.   FLUID DEFICIT:  Through the hysteroscope was approximately 10 mL.   FINDINGS:  Slightly irregular, shaggy endometrium with small possible polyp  in the anterior fundus.   DESCRIPTION OF PROCEDURE:  The patient was taken to the operating room and  then placed in the dorsal supine position. General anesthesia was induced  and she was placed in mobile stirrups. The perineum and vagina were then  prepped and draped in the usual sterile fashion and bladder drained with a  red rubber catheter. A Graves speculum was then inserted and paracervical  block was performed with 2% plain lidocaine after the anterior lip of the  cervix was grasped with a single tooth tenaculum. The uterus then sounded to  approximately 8 cm and the cervix was easily gradually dilated to a size 31  dilator. The resectoscope was then inserted and good visualization was  achieved. Inspection revealed a slightly shaggy posterior endometrium with  no lesions. Both tubal ostia were identified without difficulty. There were  a couple of sessile lesions from the anterior fundus that were either just  areas of endometrium or possibly small polyps. Visible  endometrium on the  posterior portion of the uterus was resected with a loop electrode with  cutting current. All areas of shaggy endometrium on the anterior portion of  the fundus were also resected with the loop electrode with cutting current.  The hysteroscope was then removed and curettage was performed. A small  amount of tissue was obtained. Polyp forceps were used to remove any pieces  of tissue. A hysteroscope was reinserted and the endometrium found to be  hemostatic. There were no significant lesions and no significant  endometrium. The hysteroscope was then removed. The single tooth tenaculum  was removed and bleeding controlled with pressure. All instruments were then  removed from the vagina. The patient was awakened in the operating room,  tolerated the procedure well and was taken to the recovery room in stable  condition. Counts were correct and she received no antibiotics.  Zenaida Niece, M.D.    TDM/MEDQ  D:  01/28/2003  T:  01/28/2003  Job:  [id number]

## 2010-07-27 NOTE — H&P (Signed)
[name], [name]                            ACCOUNT NO.:  [id number]   MEDICAL RECORD NO.:  [id number]                   PATIENT TYPE:  INP   LOCATION:  5705                                 FACILITY:  [facility]   PHYSICIAN:  Vikki Ports, M.D.         DATE OF BIRTH:  08-19-1945   DATE OF ADMISSION:  09/10/2003  DATE OF DISCHARGE:                                HISTORY & PHYSICAL   ADMISSION DIAGNOSIS:  Acute appendicitis.   SURGEON:  Vikki Ports, M.D.   REFERRING PHYSICIAN:  Lorre Nick, M.D.   HISTORY OF PRESENT ILLNESS:  The patient is a 65 year old white female who  began with epigastric abdominal pain 24 hours prior to presenting to the  emergency room.  The patient was seen in the emergency room after the pain  was localized to her lower abdomen.  Workup revealed an elevated white count  of 12,000, localized pain and a CT scan was consistent with acute  appendicitis.  I was then consulted.   PAST MEDICAL HISTORY:  1. Significant for mitral valve prolapse.  2. Mild hypertension.   MEDICATIONS:  Toprol 50 mg a day.   REVIEW OF SYSTEMS:  Significant for nausea, vomiting and fever to 101.2.   PHYSICAL EXAMINATION:  GENERAL:  She is an age appropriate white female in  no distress.  HEENT:  Benign.  Normocephalic, atraumatic.  Pupils are equal, round and  reactive to light.  NECK:  Supple and soft without thyromegaly or cervical adenopathy.  LUNGS:  Clear to auscultation and percussion.  HEART:  Regular rate and rhythm.  ABDOMEN:  Soft but tender, particularly in the right lower quadrant with  positive rebound.  EXTREMITIES:  No cyanosis, clubbing or edema.   IMPRESSION:  Acure appendicitis.   PLAN:  Laparoscopic appendectomy with IV antibiotics.  Consent was obtained  by discussing this both with her husband and herself.                                                Vikki Ports, M.D.    KRH/MEDQ  D:  09/10/2003  T:  09/11/2003  Job:   [id number]

## 2010-07-27 NOTE — Op Note (Signed)
[name], [name]                  ACCOUNT NO.:  [id number]   MEDICAL RECORD NO.:  [id number]          PATIENT TYPE:  INP   LOCATION:  3002                         FACILITY:  [facility]   PHYSICIAN:  Cristi Loron, M.D.DATE OF BIRTH:  1946/01/21   DATE OF PROCEDURE:  08/07/2005  DATE OF DISCHARGE:                                 OPERATIVE REPORT   BRIEF HISTORY:  The patient is a 65 year old white female who has suffered  from neck and shoulder pain.  She failed medical management and was worked  up with a cervical MRI which demonstrated she had significant spondylosis  and stenosis at C4-5.  The patient's symptoms were consistent with C5  radiculopathy.  I discussed the various treatments with the patient,  including surgery.  The patient has weighed the risks, benefits and  alternatives to surgery and decided to proceed with a C4-5 anterior cervical  diskectomy and fusion plating.   PREOPERATIVE DIAGNOSIS:  C4-5 spondylosis, stenosis, cervical radiculopathy,  and cervicalgia.   POSTOPERATIVE DIAGNOSIS:  C4-5 spondylosis, stenosis, cervical  radiculopathy, and cervicalgia.   PROCEDURE:  C4-5 extensive anterior cervical diskectomy/decompression; C4-5  interbody iliac crest allograft arthrodesis; C4-5 anterior cervical plating  (Codman Select titanium plate and screws).   SURGEON:  Cristi Loron, M.D.   ASSISTANT:  Stefani Dama, M.D.   ANESTHESIA:  General endotracheal.   ESTIMATED BLOOD LOSS:  75 cc.   SPECIMENS:  None.   DRAINS:  None.   COMPLICATIONS:  None.   DESCRIPTION OF PROCEDURE:  The patient was brought to operating room by the  anesthesia team.  General endotracheal anesthesia was induced.  The patient  remained in the supine position.  A roll was placed under her shoulders,  placing her neck in slight extension.  Her anterior cervical region was then  prepared with Betadine scrub and Betadine solution.  Sterile drapes were  applied, and then I  injected the area to be incised with Marcaine with  epinephrine solution.  I used a scalpel to make a transverse incision in the  patient's left anterior neck.  I used a Metzenbaum scissors to divide the  platysma muscle and then to dissect medial to the sternocleidomastoid  muscle, jugular vein, and carotid artery.  I carefully dissected down toward  the anterior cervical spine, identifying the esophagus and retracting it  medially.  I then used a Kittner swab to clear the soft tissue from the  anterior cervical spine and then inserted a bent spinal needle to help  exposed intervertebral disc space.  We obtained an intraoperative radiograph  to confirm our location.   I then used electrocautery to detach the medial border of the longus colli  muscle bilateral from the C4-5 intervertebral disk space.  I then the Caspar  self-retaining retractor for exposure, and then used a 15 blade scalpel to  incise the C4-5 intervertebral disc.  I then performed a partial diskectomy  using the Karlin curettes and the pituitary forceps.  I then inserted  distraction screws at C4 and C5 and then distracted the interspace.  We  brought the operative microscope into field and under its magnification and  illumination completed the microdissection/decompression.  I used a high-  speed drill to decorticate the vertebral bodies of C4-5, drill away the  remainder of C4-5 intervertebral disc, drill away some posterior  spondylosis, and to thin out the posterior longitudinal ligament.  I then  incised the thinned-out ligament with the arachnoid knife and then removed  it with the Kerrison punch, undercutting the vertebral endplates at C4-5,  decompressing the thecal sac.  I then performed a foraminotomy about the  bilateral C5 nerve root, completing the decompression.   We now turned attention to arthrodesis.  I obtained an iliac crest  tricortical allograft bone graft and fashioned it to its approximate   dimensions, 6 mm height, 1 cm depth, inserted the bone graft and distracted  the C4-5 interspace.  I then removed the distraction screws.  There was good  snug fit of the bone graft.   We now turned our attention to the anterior spinal instrumentation.  I used  a high-speed drill to remove some ventral spondylosis from the C4-5 disc  space anteriorly so that the plate would lie down flat.  Then, I selected  the appropriate length Codman Slim-LOC anterior cervical plate and laid it  along the anterior aspect of the vertebral bodies of C4 and C5.  I then  drilled two 12 mm holes at C4 toward C5 and then secured the plates to the  vertebral bodies by placing two 12 mm self-tapping screws at C4 and C5.  We  then obtained intraoperative radiograph that demonstrated good position of  plate, screws, of my graft.  I then secured the screws to the plate by  locking each cam.   We then obtained hemostasis using bipolar electrocautery.  We irrigated the  wound out bacitracin solution, removed the retractor, and then we inspected  the esophagus for any damage.  There was none apparent.  We then  reapproximated the patient's platysma muscle with interrupted 3-0 Vicryl  sutures, subcutaneous tissue with interrupted 3-0 Vicryl suture, and the  skin with Steri-Strips with Benzoin.  The wound was then coated bacitracin  ointment, sterile dressing applied, and the drapes were removed.  The  patient was subsequently extubated by the anesthesia team and transported to  post anesthesia care unit in stable condition.  All sponge, instrument, and  needle counts were correct at the end of this case.      Cristi Loron, M.D.  Electronically Signed     JDJ/MEDQ  D:  08/08/2005  T:  08/08/2005  Job:  [id number]

## 2010-08-13 ENCOUNTER — Ambulatory Visit: Payer: BC Managed Care – PPO | Admitting: Cardiovascular Disease

## 2010-08-15 ENCOUNTER — Encounter: Payer: Self-pay | Admitting: Cardiovascular Disease

## 2010-08-15 ENCOUNTER — Encounter: Payer: Self-pay | Admitting: *Deleted

## 2010-08-16 ENCOUNTER — Ambulatory Visit (INDEPENDENT_AMBULATORY_CARE_PROVIDER_SITE_OTHER): Payer: BC Managed Care – PPO | Admitting: Cardiovascular Disease

## 2010-08-16 ENCOUNTER — Encounter: Payer: Self-pay | Admitting: Cardiovascular Disease

## 2010-08-16 DIAGNOSIS — I10 Essential (primary) hypertension: Secondary | ICD-10-CM

## 2010-08-16 DIAGNOSIS — R079 Chest pain, unspecified: Secondary | ICD-10-CM

## 2010-08-16 DIAGNOSIS — I059 Rheumatic mitral valve disease, unspecified: Secondary | ICD-10-CM

## 2010-08-16 DIAGNOSIS — E782 Mixed hyperlipidemia: Secondary | ICD-10-CM

## 2010-08-16 MED ORDER — LOSARTAN POTASSIUM-HCTZ 100-12.5 MG PO TABS: 1.0000 | ORAL_TABLET | Freq: Every day | ORAL | Status: DC

## 2010-08-16 NOTE — Assessment & Plan Note (Signed)
Atypical Normal cath in 2010  But recurring requiring ER visit and some help with nitro.  Promise Trial

## 2010-08-16 NOTE — Assessment & Plan Note (Signed)
Go back to 25mg  Toprol.  Stop ExForge and start Hyzaar.  F/U 3 months

## 2010-08-16 NOTE — Assessment & Plan Note (Signed)
Cholesterol is at goal.  Continue current dose of statin and diet Rx.  No myalgias or side effects.  F/U  LFT's in 6 months. Lab Results  Component Value Date   LDLCALC  Value: 86        Total Cholesterol/HDL:CHD Risk Coronary Heart Disease Risk Table                     Men   Women  1/2 Average Risk   3.4   3.3  Average Risk       5.0   4.4  2 X Average Risk   9.6   7.1  3 X Average Risk  23.4   11.0        Use the calculated Patient Ratio above and the CHD Risk Table to determine the patient's CHD Risk.        ATP III CLASSIFICATION (LDL):  <100     mg/dL   Optimal  161-096  mg/dL   Near or Above                    Optimal  130-159  mg/dL   Borderline  045-409  mg/dL   High  >811     mg/dL   Very High 11/23/7827

## 2010-08-16 NOTE — Assessment & Plan Note (Signed)
Benign no murmur on exam

## 2010-08-16 NOTE — Patient Instructions (Signed)
Your physician recommends that you schedule a follow-up appointment in: 3 MONTHS  STOP EXFORGE  START LOSARTAN-POTASSIUM 100-12.5 MG ONCE DAILY  Your physician has requested that you have a stress echocardiogram. For further information please visit [email or web address] Please follow instruction sheet as given.

## 2010-08-16 NOTE — Progress Notes (Signed)
65 yo referred by Dr Juleen China for SSCP.  Seen in ER 05/02/10.  Reviewed records.  R/O normal ECG.  History of MVP and previous SSCP with two previous normal stress tests and normal cath in 2010.  Pain is persistant and recurring.  Lasts 15-20 minutes.  Not necessarily exertional but lately helped with nitro.  No associated diaphoresis, dyspnea, palpitations or syncope.  Has radiation to jaw and usually occurs at work where some stress is involved. BP has been running high on review of records from work, home and ER.  Dr Juleen China increased Toprol but she runs a low HR and has more fatigue.  Discussed randomization to Promise Trial with stress echo vs CTA.  Willing to enroll.  ROS: Denies fever, malais, weight loss, blurry vision, decreased visual acuity, cough, sputum, SOB, hemoptysis, pleuritic pain, palpitaitons, heartburn, abdominal pain, melena, lower extremity edema, claudication, or rash.  All other systems reviewed and negative   General: Affect appropriate Healthy:  appears stated age HEENT: normal Neck supple with no adenopathy JVP normal no bruits no thyromegaly Lungs clear with no wheezing and good diaphragmatic motion Heart:  S1/S2 no murmur,rub, gallop or click PMI normal Abdomen: benighn, BS positve, no tenderness, no AAA no bruit.  No HSM or HJR Distal pulses intact with no bruits No edema Neuro non-focal Skin warm and dry No muscular weakness  Medications Current Outpatient Prescriptions  Medication Sig Dispense Refill  . amLODipine-valsartan (EXFORGE) 5-160 MG per tablet Take 1 tablet by mouth daily.        Marland Kitchen aspirin 81 MG tablet Take 81 mg by mouth daily.        Marland Kitchen LORazepam (ATIVAN PO) as needed.       . metoprolol (TOPROL-XL) 50 MG 24 hr tablet 1/2 tab po bid      . Multiple Vitamins-Minerals (CENTRUM SILVER PO) 1 tab po qd       . rosuvastatin (CRESTOR) 20 MG tablet 1/2 tab po qd       . sertraline (ZOLOFT) 100 MG tablet Take 100 mg by mouth daily.        Marland Kitchen DISCONTD:  Ascorbic Acid (VITAMIN C) 1000 MG tablet Take 1,000 mg by mouth daily.          Allergies Codeine and Minocin  Family History: Family History  Problem Relation Age of Onset  . Coronary artery disease      Social History: History   Social History  . Marital Status: Married    Spouse Name: N/A    Number of Children: N/A  . Years of Education: N/A   Occupational History  . Not on file.   Social History Main Topics  . Smoking status: Not on file  . Smokeless tobacco: Not on file  . Alcohol Use: No  . Drug Use: No  . Sexually Active: Not on file   Other Topics Concern  . Not on file   Social History Narrative     [place] in [place].     She is employed with a [employer] in [place].  She is a     nonsmoker, nondrinker and has moderate to heavy caffeine intake in the     form of sodas.  (07/06/2008)    Electrocardiogram:  05/02/10, 6,7,12  NSR normal ECG  Assessment and Plan

## 2010-08-20 ENCOUNTER — Encounter: Payer: Self-pay | Admitting: *Deleted

## 2010-08-29 ENCOUNTER — Ambulatory Visit (HOSPITAL_BASED_OUTPATIENT_CLINIC_OR_DEPARTMENT_OTHER): Payer: BC Managed Care – PPO | Admitting: Radiology

## 2010-08-29 ENCOUNTER — Ambulatory Visit (HOSPITAL_COMMUNITY): Payer: BC Managed Care – PPO | Attending: Endocrinology | Admitting: Radiology

## 2010-08-29 DIAGNOSIS — I10 Essential (primary) hypertension: Secondary | ICD-10-CM | POA: Insufficient documentation

## 2010-08-29 DIAGNOSIS — E785 Hyperlipidemia, unspecified: Secondary | ICD-10-CM | POA: Insufficient documentation

## 2010-08-29 DIAGNOSIS — R072 Precordial pain: Secondary | ICD-10-CM | POA: Insufficient documentation

## 2010-08-29 DIAGNOSIS — R0989 Other specified symptoms and signs involving the circulatory and respiratory systems: Secondary | ICD-10-CM

## 2010-08-29 DIAGNOSIS — R0609 Other forms of dyspnea: Secondary | ICD-10-CM | POA: Insufficient documentation

## 2010-08-29 DIAGNOSIS — R079 Chest pain, unspecified: Secondary | ICD-10-CM

## 2010-12-06 ENCOUNTER — Ambulatory Visit: Payer: BC Managed Care – PPO | Admitting: Cardiovascular Disease

## 2010-12-07 LAB — BUN: BUN: 10

## 2010-12-07 LAB — CREATININE, SERUM
Creatinine, Ser: 0.69
GFR calc Af Amer: >60
GFR calc non Af Amer: >60

## 2010-12-11 ENCOUNTER — Encounter: Payer: Self-pay | Admitting: Cardiovascular Disease

## 2014-01-11 ENCOUNTER — Encounter: Payer: Self-pay | Admitting: Cardiovascular Disease

## 2015-04-06 DIAGNOSIS — E789 Disorder of lipoprotein metabolism, unspecified: Secondary | ICD-10-CM | POA: Diagnosis not present

## 2015-04-14 DIAGNOSIS — T887XXA Unspecified adverse effect of drug or medicament, initial encounter: Secondary | ICD-10-CM | POA: Diagnosis not present

## 2015-04-14 DIAGNOSIS — E789 Disorder of lipoprotein metabolism, unspecified: Secondary | ICD-10-CM | POA: Diagnosis not present

## 2015-04-14 DIAGNOSIS — E559 Vitamin D deficiency, unspecified: Secondary | ICD-10-CM | POA: Diagnosis not present

## 2015-04-14 DIAGNOSIS — I10 Essential (primary) hypertension: Secondary | ICD-10-CM | POA: Diagnosis not present

## 2015-07-13 DIAGNOSIS — H5203 Hypermetropia, bilateral: Secondary | ICD-10-CM | POA: Diagnosis not present

## 2015-08-02 DIAGNOSIS — J0100 Acute maxillary sinusitis, unspecified: Secondary | ICD-10-CM | POA: Diagnosis not present

## 2015-08-09 DIAGNOSIS — E789 Disorder of lipoprotein metabolism, unspecified: Secondary | ICD-10-CM | POA: Diagnosis not present

## 2015-08-09 DIAGNOSIS — I10 Essential (primary) hypertension: Secondary | ICD-10-CM | POA: Diagnosis not present

## 2015-08-22 DIAGNOSIS — E789 Disorder of lipoprotein metabolism, unspecified: Secondary | ICD-10-CM | POA: Diagnosis not present

## 2015-08-22 DIAGNOSIS — I10 Essential (primary) hypertension: Secondary | ICD-10-CM | POA: Diagnosis not present

## 2015-09-03 DIAGNOSIS — J069 Acute upper respiratory infection, unspecified: Secondary | ICD-10-CM | POA: Diagnosis not present

## 2015-11-22 DIAGNOSIS — Z23 Encounter for immunization: Secondary | ICD-10-CM | POA: Diagnosis not present

## 2015-12-11 DIAGNOSIS — H43812 Vitreous degeneration, left eye: Secondary | ICD-10-CM | POA: Diagnosis not present

## 2015-12-14 DIAGNOSIS — E789 Disorder of lipoprotein metabolism, unspecified: Secondary | ICD-10-CM | POA: Diagnosis not present

## 2015-12-14 DIAGNOSIS — I10 Essential (primary) hypertension: Secondary | ICD-10-CM | POA: Diagnosis not present

## 2015-12-21 DIAGNOSIS — I10 Essential (primary) hypertension: Secondary | ICD-10-CM | POA: Diagnosis not present

## 2015-12-21 DIAGNOSIS — E789 Disorder of lipoprotein metabolism, unspecified: Secondary | ICD-10-CM | POA: Diagnosis not present

## 2015-12-21 DIAGNOSIS — Z23 Encounter for immunization: Secondary | ICD-10-CM | POA: Diagnosis not present

## 2016-06-17 DIAGNOSIS — E789 Disorder of lipoprotein metabolism, unspecified: Secondary | ICD-10-CM | POA: Diagnosis not present

## 2016-06-17 DIAGNOSIS — I10 Essential (primary) hypertension: Secondary | ICD-10-CM | POA: Diagnosis not present

## 2016-06-20 DIAGNOSIS — I10 Essential (primary) hypertension: Secondary | ICD-10-CM | POA: Diagnosis not present

## 2016-06-20 DIAGNOSIS — E559 Vitamin D deficiency, unspecified: Secondary | ICD-10-CM | POA: Diagnosis not present

## 2016-06-20 DIAGNOSIS — E789 Disorder of lipoprotein metabolism, unspecified: Secondary | ICD-10-CM | POA: Diagnosis not present

## 2016-06-20 DIAGNOSIS — Z23 Encounter for immunization: Secondary | ICD-10-CM | POA: Diagnosis not present

## 2016-06-20 DIAGNOSIS — R899 Unspecified abnormal finding in specimens from other organs, systems and tissues: Secondary | ICD-10-CM | POA: Diagnosis not present

## 2016-09-05 DIAGNOSIS — H2513 Age-related nuclear cataract, bilateral: Secondary | ICD-10-CM | POA: Diagnosis not present

## 2016-09-05 DIAGNOSIS — H5203 Hypermetropia, bilateral: Secondary | ICD-10-CM | POA: Diagnosis not present

## 2016-09-05 DIAGNOSIS — H02403 Unspecified ptosis of bilateral eyelids: Secondary | ICD-10-CM | POA: Diagnosis not present

## 2016-12-09 DIAGNOSIS — R7989 Other specified abnormal findings of blood chemistry: Secondary | ICD-10-CM | POA: Diagnosis not present

## 2016-12-09 DIAGNOSIS — Z0000 Encounter for general adult medical examination without abnormal findings: Secondary | ICD-10-CM | POA: Diagnosis not present

## 2016-12-09 DIAGNOSIS — E663 Overweight: Secondary | ICD-10-CM | POA: Diagnosis not present

## 2016-12-09 DIAGNOSIS — Z8249 Family history of ischemic heart disease and other diseases of the circulatory system: Secondary | ICD-10-CM | POA: Diagnosis not present

## 2016-12-09 DIAGNOSIS — Z23 Encounter for immunization: Secondary | ICD-10-CM | POA: Diagnosis not present

## 2016-12-09 DIAGNOSIS — I10 Essential (primary) hypertension: Secondary | ICD-10-CM | POA: Diagnosis not present

## 2016-12-09 DIAGNOSIS — E7800 Pure hypercholesterolemia, unspecified: Secondary | ICD-10-CM | POA: Diagnosis not present

## 2016-12-09 DIAGNOSIS — I341 Nonrheumatic mitral (valve) prolapse: Secondary | ICD-10-CM | POA: Diagnosis not present

## 2017-06-04 DIAGNOSIS — E7800 Pure hypercholesterolemia, unspecified: Secondary | ICD-10-CM | POA: Diagnosis not present

## 2017-06-09 DIAGNOSIS — R1310 Dysphagia, unspecified: Secondary | ICD-10-CM | POA: Diagnosis not present

## 2017-06-09 DIAGNOSIS — I10 Essential (primary) hypertension: Secondary | ICD-10-CM | POA: Diagnosis not present

## 2017-06-09 DIAGNOSIS — E7800 Pure hypercholesterolemia, unspecified: Secondary | ICD-10-CM | POA: Diagnosis not present

## 2017-07-08 DIAGNOSIS — Z1211 Encounter for screening for malignant neoplasm of colon: Secondary | ICD-10-CM | POA: Diagnosis not present

## 2017-07-08 DIAGNOSIS — R1310 Dysphagia, unspecified: Secondary | ICD-10-CM | POA: Diagnosis not present

## 2017-07-09 DIAGNOSIS — Z0184 Encounter for antibody response examination: Secondary | ICD-10-CM | POA: Diagnosis not present

## 2017-08-05 DIAGNOSIS — K222 Esophageal obstruction: Secondary | ICD-10-CM | POA: Diagnosis not present

## 2017-08-05 DIAGNOSIS — K209 Esophagitis, unspecified: Secondary | ICD-10-CM | POA: Diagnosis not present

## 2017-08-05 DIAGNOSIS — K2930 Chronic superficial gastritis without bleeding: Secondary | ICD-10-CM | POA: Diagnosis not present

## 2017-08-05 DIAGNOSIS — R1310 Dysphagia, unspecified: Secondary | ICD-10-CM | POA: Diagnosis not present

## 2017-08-05 DIAGNOSIS — K449 Diaphragmatic hernia without obstruction or gangrene: Secondary | ICD-10-CM | POA: Diagnosis not present

## 2017-08-05 DIAGNOSIS — K5730 Diverticulosis of large intestine without perforation or abscess without bleeding: Secondary | ICD-10-CM | POA: Diagnosis not present

## 2017-08-05 DIAGNOSIS — K648 Other hemorrhoids: Secondary | ICD-10-CM | POA: Diagnosis not present

## 2017-08-05 DIAGNOSIS — D126 Benign neoplasm of colon, unspecified: Secondary | ICD-10-CM | POA: Diagnosis not present

## 2017-08-05 DIAGNOSIS — Q438 Other specified congenital malformations of intestine: Secondary | ICD-10-CM | POA: Diagnosis not present

## 2017-08-05 DIAGNOSIS — Z1211 Encounter for screening for malignant neoplasm of colon: Secondary | ICD-10-CM | POA: Diagnosis not present

## 2017-08-11 DIAGNOSIS — K2930 Chronic superficial gastritis without bleeding: Secondary | ICD-10-CM | POA: Diagnosis not present

## 2017-08-11 DIAGNOSIS — Z1211 Encounter for screening for malignant neoplasm of colon: Secondary | ICD-10-CM | POA: Diagnosis not present

## 2017-08-11 DIAGNOSIS — K209 Esophagitis, unspecified: Secondary | ICD-10-CM | POA: Diagnosis not present

## 2017-08-11 DIAGNOSIS — D126 Benign neoplasm of colon, unspecified: Secondary | ICD-10-CM | POA: Diagnosis not present

## 2017-08-26 DIAGNOSIS — J209 Acute bronchitis, unspecified: Secondary | ICD-10-CM | POA: Diagnosis not present

## 2017-08-26 DIAGNOSIS — J0100 Acute maxillary sinusitis, unspecified: Secondary | ICD-10-CM | POA: Diagnosis not present

## 2017-08-29 DIAGNOSIS — J209 Acute bronchitis, unspecified: Secondary | ICD-10-CM | POA: Diagnosis not present

## 2017-11-11 DIAGNOSIS — Z86010 Personal history of colonic polyps: Secondary | ICD-10-CM | POA: Diagnosis not present

## 2017-11-11 DIAGNOSIS — K200 Eosinophilic esophagitis: Secondary | ICD-10-CM | POA: Diagnosis not present

## 2017-11-11 DIAGNOSIS — Q393 Congenital stenosis and stricture of esophagus: Secondary | ICD-10-CM | POA: Diagnosis not present

## 2017-12-10 DIAGNOSIS — I10 Essential (primary) hypertension: Secondary | ICD-10-CM | POA: Diagnosis not present

## 2017-12-10 DIAGNOSIS — R7989 Other specified abnormal findings of blood chemistry: Secondary | ICD-10-CM | POA: Diagnosis not present

## 2017-12-15 DIAGNOSIS — I341 Nonrheumatic mitral (valve) prolapse: Secondary | ICD-10-CM | POA: Diagnosis not present

## 2017-12-15 DIAGNOSIS — E663 Overweight: Secondary | ICD-10-CM | POA: Diagnosis not present

## 2017-12-15 DIAGNOSIS — I700 Atherosclerosis of aorta: Secondary | ICD-10-CM | POA: Diagnosis not present

## 2017-12-15 DIAGNOSIS — I10 Essential (primary) hypertension: Secondary | ICD-10-CM | POA: Diagnosis not present

## 2017-12-15 DIAGNOSIS — Z23 Encounter for immunization: Secondary | ICD-10-CM | POA: Diagnosis not present

## 2017-12-15 DIAGNOSIS — E7800 Pure hypercholesterolemia, unspecified: Secondary | ICD-10-CM | POA: Diagnosis not present

## 2017-12-15 DIAGNOSIS — M7062 Trochanteric bursitis, left hip: Secondary | ICD-10-CM | POA: Diagnosis not present

## 2017-12-15 DIAGNOSIS — Z0000 Encounter for general adult medical examination without abnormal findings: Secondary | ICD-10-CM | POA: Diagnosis not present

## 2017-12-15 DIAGNOSIS — M7061 Trochanteric bursitis, right hip: Secondary | ICD-10-CM | POA: Diagnosis not present

## 2017-12-15 DIAGNOSIS — K824 Cholesterolosis of gallbladder: Secondary | ICD-10-CM | POA: Diagnosis not present

## 2017-12-15 DIAGNOSIS — R7989 Other specified abnormal findings of blood chemistry: Secondary | ICD-10-CM | POA: Diagnosis not present

## 2017-12-15 DIAGNOSIS — R42 Dizziness and giddiness: Secondary | ICD-10-CM | POA: Diagnosis not present

## 2017-12-16 DIAGNOSIS — R42 Dizziness and giddiness: Secondary | ICD-10-CM | POA: Diagnosis not present

## 2017-12-18 DIAGNOSIS — K824 Cholesterolosis of gallbladder: Secondary | ICD-10-CM | POA: Diagnosis not present

## 2017-12-24 DIAGNOSIS — H2513 Age-related nuclear cataract, bilateral: Secondary | ICD-10-CM | POA: Diagnosis not present

## 2017-12-24 DIAGNOSIS — H5203 Hypermetropia, bilateral: Secondary | ICD-10-CM | POA: Diagnosis not present

## 2017-12-24 DIAGNOSIS — H25013 Cortical age-related cataract, bilateral: Secondary | ICD-10-CM | POA: Diagnosis not present

## 2018-02-03 DIAGNOSIS — H18413 Arcus senilis, bilateral: Secondary | ICD-10-CM | POA: Diagnosis not present

## 2018-02-03 DIAGNOSIS — H25013 Cortical age-related cataract, bilateral: Secondary | ICD-10-CM | POA: Diagnosis not present

## 2018-02-03 DIAGNOSIS — H2511 Age-related nuclear cataract, right eye: Secondary | ICD-10-CM | POA: Diagnosis not present

## 2018-02-03 DIAGNOSIS — H2513 Age-related nuclear cataract, bilateral: Secondary | ICD-10-CM | POA: Diagnosis not present

## 2018-02-03 DIAGNOSIS — H25043 Posterior subcapsular polar age-related cataract, bilateral: Secondary | ICD-10-CM | POA: Diagnosis not present

## 2018-02-03 DIAGNOSIS — H02831 Dermatochalasis of right upper eyelid: Secondary | ICD-10-CM | POA: Diagnosis not present

## 2018-03-20 DIAGNOSIS — H66009 Acute suppurative otitis media without spontaneous rupture of ear drum, unspecified ear: Secondary | ICD-10-CM | POA: Diagnosis not present

## 2018-04-06 DIAGNOSIS — H2511 Age-related nuclear cataract, right eye: Secondary | ICD-10-CM | POA: Diagnosis not present

## 2018-04-06 DIAGNOSIS — H52201 Unspecified astigmatism, right eye: Secondary | ICD-10-CM | POA: Diagnosis not present

## 2018-04-07 DIAGNOSIS — H2512 Age-related nuclear cataract, left eye: Secondary | ICD-10-CM | POA: Diagnosis not present

## 2018-04-27 DIAGNOSIS — H2512 Age-related nuclear cataract, left eye: Secondary | ICD-10-CM | POA: Diagnosis not present

## 2018-12-08 DIAGNOSIS — D649 Anemia, unspecified: Secondary | ICD-10-CM | POA: Diagnosis not present

## 2018-12-08 DIAGNOSIS — I10 Essential (primary) hypertension: Secondary | ICD-10-CM | POA: Diagnosis not present

## 2018-12-11 ENCOUNTER — Other Ambulatory Visit: Payer: Self-pay

## 2018-12-11 DIAGNOSIS — R509 Fever, unspecified: Secondary | ICD-10-CM | POA: Diagnosis not present

## 2018-12-11 DIAGNOSIS — Z20822 Contact with and (suspected) exposure to covid-19: Secondary | ICD-10-CM

## 2018-12-11 DIAGNOSIS — J069 Acute upper respiratory infection, unspecified: Secondary | ICD-10-CM | POA: Diagnosis not present

## 2018-12-11 DIAGNOSIS — J205 Acute bronchitis due to respiratory syncytial virus: Secondary | ICD-10-CM | POA: Diagnosis not present

## 2018-12-12 LAB — NOVEL CORONAVIRUS, NAA: SARS-CoV-2, NAA: NOT DETECTED

## 2018-12-22 DIAGNOSIS — H9193 Unspecified hearing loss, bilateral: Secondary | ICD-10-CM | POA: Diagnosis not present

## 2018-12-22 DIAGNOSIS — I10 Essential (primary) hypertension: Secondary | ICD-10-CM | POA: Diagnosis not present

## 2018-12-22 DIAGNOSIS — K200 Eosinophilic esophagitis: Secondary | ICD-10-CM | POA: Diagnosis not present

## 2018-12-22 DIAGNOSIS — E7800 Pure hypercholesterolemia, unspecified: Secondary | ICD-10-CM | POA: Diagnosis not present

## 2018-12-22 DIAGNOSIS — I700 Atherosclerosis of aorta: Secondary | ICD-10-CM | POA: Diagnosis not present

## 2018-12-22 DIAGNOSIS — K824 Cholesterolosis of gallbladder: Secondary | ICD-10-CM | POA: Diagnosis not present

## 2018-12-22 DIAGNOSIS — Z23 Encounter for immunization: Secondary | ICD-10-CM | POA: Diagnosis not present

## 2018-12-22 DIAGNOSIS — Z0000 Encounter for general adult medical examination without abnormal findings: Secondary | ICD-10-CM | POA: Diagnosis not present

## 2018-12-22 DIAGNOSIS — F439 Reaction to severe stress, unspecified: Secondary | ICD-10-CM | POA: Diagnosis not present

## 2018-12-31 DIAGNOSIS — K824 Cholesterolosis of gallbladder: Secondary | ICD-10-CM | POA: Diagnosis not present

## 2019-02-09 DIAGNOSIS — H903 Sensorineural hearing loss, bilateral: Secondary | ICD-10-CM | POA: Diagnosis not present

## 2019-02-09 DIAGNOSIS — H9313 Tinnitus, bilateral: Secondary | ICD-10-CM | POA: Diagnosis not present

## 2019-10-05 ENCOUNTER — Other Ambulatory Visit: Payer: Self-pay

## 2019-11-14 DIAGNOSIS — J019 Acute sinusitis, unspecified: Secondary | ICD-10-CM | POA: Diagnosis not present

## 2019-11-14 DIAGNOSIS — Z20828 Contact with and (suspected) exposure to other viral communicable diseases: Secondary | ICD-10-CM | POA: Diagnosis not present

## 2019-11-14 DIAGNOSIS — R05 Cough: Secondary | ICD-10-CM | POA: Diagnosis not present

## 2019-12-13 DIAGNOSIS — I10 Essential (primary) hypertension: Secondary | ICD-10-CM | POA: Diagnosis not present

## 2019-12-13 DIAGNOSIS — N390 Urinary tract infection, site not specified: Secondary | ICD-10-CM | POA: Diagnosis not present

## 2019-12-27 DIAGNOSIS — I10 Essential (primary) hypertension: Secondary | ICD-10-CM | POA: Diagnosis not present

## 2019-12-27 DIAGNOSIS — K824 Cholesterolosis of gallbladder: Secondary | ICD-10-CM | POA: Diagnosis not present

## 2019-12-27 DIAGNOSIS — Z0000 Encounter for general adult medical examination without abnormal findings: Secondary | ICD-10-CM | POA: Diagnosis not present

## 2019-12-27 DIAGNOSIS — H905 Unspecified sensorineural hearing loss: Secondary | ICD-10-CM | POA: Diagnosis not present

## 2019-12-27 DIAGNOSIS — K200 Eosinophilic esophagitis: Secondary | ICD-10-CM | POA: Diagnosis not present

## 2019-12-27 DIAGNOSIS — Z23 Encounter for immunization: Secondary | ICD-10-CM | POA: Diagnosis not present

## 2019-12-27 DIAGNOSIS — I700 Atherosclerosis of aorta: Secondary | ICD-10-CM | POA: Diagnosis not present

## 2019-12-27 DIAGNOSIS — Z881 Allergy status to other antibiotic agents status: Secondary | ICD-10-CM | POA: Diagnosis not present

## 2020-01-20 DIAGNOSIS — R7301 Impaired fasting glucose: Secondary | ICD-10-CM | POA: Diagnosis not present

## 2020-01-20 DIAGNOSIS — E7800 Pure hypercholesterolemia, unspecified: Secondary | ICD-10-CM | POA: Diagnosis not present

## 2020-01-20 DIAGNOSIS — I10 Essential (primary) hypertension: Secondary | ICD-10-CM | POA: Diagnosis not present

## 2020-01-20 DIAGNOSIS — I341 Nonrheumatic mitral (valve) prolapse: Secondary | ICD-10-CM | POA: Diagnosis not present

## 2020-01-20 DIAGNOSIS — R7303 Prediabetes: Secondary | ICD-10-CM | POA: Diagnosis not present

## 2020-01-30 NOTE — Progress Notes (Addendum)
Cardiology Office Note:    Date:  01/31/2020   ID:  [name], DOB [birth year], MRN [id number]  PCP:  Anda Kraft, MD  Cardiologist:  No primary care provider on file.  Electrophysiologist:  None   Referring MD: Anda Kraft, MD   Chief Complaint  Patient presents with  . Chest Pain    History of Present Illness:    [name] is a 74 y.o. female with a hx of mitral valve prolapse, hypertension, hyperlipidemia who presents for an evaluation of chest pain.  She previously followed with Dr. Johnsie Cancel, last seen in 2012.  She had normal heart catheterization in 2010.  Stress echo in 2012 showed no evidence of ischemia, normal systolic function.  She reports she has been having chest pain that she describes as burning/tightness in center of her chest, that occurs when she is stressed.  She walks twice per week for 3.5 miles, denies any exertional chest pain.  Chest pain only occurs when she is under significant stress.  States that this past year was homeschooling her 84 year old granddaughter and would frequently have the chest pain at that time.  Reports episodes can last for up to 2 minutes.  Does report a history of esophageal spasm as well.  Also has been having rare palpitations.  Denies any lower extremity edema.  Reports occasional lightheadedness that has been attributed to vertigo, denies any syncope.  Does report she has been having burning/numbness in her legs.  No smoking history.  Family history includes mother died of MI at age 43, father had MI in 16s, and brother died of MI in 13s.   Past Medical History:  Diagnosis Date  . Anxiety state, unspecified   . CHEST PAIN UNSPECIFIED   . Essential hypertension, benign   . MITRAL VALVE PROLAPSE   . Mixed hyperlipidemia       Current Medications: Current Meds  Medication Sig  . atorvastatin (LIPITOR) 40 MG tablet Take 40 mg by mouth at bedtime.  . cetirizine (EQ ALLERGY RELIEF, CETIRIZINE,) 10 MG tablet   .  Cholecalciferol (VITAMIN D3) 25 MCG (1000 UT) CAPS   . hydrochlorothiazide (HYDRODIURIL) 12.5 MG tablet   . loratadine (CLARITIN) 10 MG tablet   . LORazepam (ATIVAN PO) as needed.   Marland Kitchen losartan (COZAAR) 100 MG tablet   . metoprolol succinate (TOPROL-XL) 25 MG 24 hr tablet   . Multiple Vitamins-Minerals (CENTRUM SILVER PO) 1 tab po qd   . omeprazole (PRILOSEC) 20 MG capsule   . OZEMPIC, 0.25 OR 0.5 MG/DOSE, 2 MG/1.5ML SOPN Inject into the skin.  . Pseudoephedrine HCl 30 MG CAPA      Allergies:   Codeine and Minocycline hcl   Social History   Socioeconomic History  . Marital status: Married    Spouse name: Not on file  . Number of children: Not on file  . Years of education: Not on file  . Highest education level: Not on file  Occupational History  . Not on file  Tobacco Use  . Smoking status: Never Smoker  . Smokeless tobacco: Never Used  Substance and Sexual Activity  . Alcohol use: No  . Drug use: No  . Sexual activity: Yes  Other Topics Concern  . Not on file  Social History Narrative     Lives in [place], [place] with her husband.        She is employed with a [employer] in [place].  She is a  nonsmoker, nondrinker and has moderate to heavy caffeine intake in the        form of sodas.  (07/06/2008)   Social Determinants of Health   Financial Resource Strain:   . Difficulty of Paying Living Expenses: Not on file  Food Insecurity:   . Worried About Charity fundraiser in the Last Year: Not on file  . Ran Out of Food in the Last Year: Not on file  Transportation Needs:   . Lack of Transportation (Medical): Not on file  . Lack of Transportation (Non-Medical): Not on file  Physical Activity:   . Days of Exercise per Week: Not on file  . Minutes of Exercise per Session: Not on file  Stress:   . Feeling of Stress : Not on file  Social Connections:   . Frequency of Communication with Friends and Family: Not on file  . Frequency of Social  Gatherings with Friends and Family: Not on file  . Attends Religious Services: Not on file  . Active Member of Clubs or Organizations: Not on file  . Attends Archivist Meetings: Not on file  . Marital Status: Not on file     Family History: The patient's family history includes Coronary artery disease in her unknown relative.  ROS:   Please see the history of present illness.     All other systems reviewed and are negative.  EKGs/Labs/Other Studies Reviewed:    The following studies were reviewed today:   EKG:  EKG is  ordered today.  The ekg ordered today demonstrates normal sinus rhythm, rate 64, no ST/T abnormalities  Recent Labs: No results found for requested labs within last 8760 hours.  Recent Lipid Panel    Component Value Date/Time   CHOL  07/02/2008 0230    149        ATP III CLASSIFICATION:  <200     mg/dL   Desirable  200-239  mg/dL   Borderline High  >=240    mg/dL   High          TRIG 117 07/02/2008 0230   HDL 40 07/02/2008 0230   CHOLHDL 3.7 07/02/2008 0230   VLDL 23 07/02/2008 0230   LDLCALC  07/02/2008 0230    86        Total Cholesterol/HDL:CHD Risk Coronary Heart Disease Risk Table                     Men   Women  1/2 Average Risk   3.4   3.3  Average Risk       5.0   4.4  2 X Average Risk   9.6   7.1  3 X Average Risk  23.4   11.0        Use the calculated Patient Ratio above and the CHD Risk Table to determine the patient's CHD Risk.        ATP III CLASSIFICATION (LDL):  <100     mg/dL   Optimal  100-129  mg/dL   Near or Above                    Optimal  130-159  mg/dL   Borderline  160-189  mg/dL   High  >190     mg/dL   Very High    Physical Exam:    VS:  BP 129/80   Pulse 64   Ht 5\' 8"  (1.727 m)   Wt 171 lb 3.2 oz (  77.7 kg)   SpO2 99%   BMI 26.03 kg/m     Wt Readings from Last 3 Encounters:  01/31/20 171 lb 3.2 oz (77.7 kg)  08/16/10 174 lb (78.9 kg)     GEN: Well nourished, well developed in no acute  distress HEENT: Normal NECK: No JVD; No carotid bruits LYMPHATICS: No lymphadenopathy CARDIAC: RRR, 2/6 systolic murmur RESPIRATORY:  Clear to auscultation without rales, wheezing or rhonchi  ABDOMEN: Soft, non-tender, non-distended MUSCULOSKELETAL:  No edema; No deformity  SKIN: Warm and dry NEUROLOGIC:  Alert and oriented x 3 PSYCHIATRIC:  Normal affect   ASSESSMENT:    1. Chest pain of uncertain etiology   2. MVP (mitral valve prolapse)   3. Bilateral leg pain   4. Hyperlipidemia, unspecified hyperlipidemia type   5. Essential hypertension    PLAN:    Chest pain: Description concerning for angina, as describes substernal chest pressure occurring when stressed.  She had normal heart catheterization in 2010 and negative stress echo in 2012.  Will evaluate for ischemia with Lexiscan Myoview.  Mitral valve prolapse: Reported history.  Systolic murmur on exam.  Will evaluate further with echocardiogram  Leg pain: Will check ABIs  Hyperlipidemia: LDL 104 on 12/13/2019.  On atorvastatin 40 mg daily.  Will check calcium score to guide aggressiveness in lowering cholesterol.  Hypertension: On hydrochlorothiazide 12.5 mg daily, Toprol-XL 25 mg daily, losartan 100 mg daily.  Appears controlled  RTC in 3 months   The risks [chest pain, shortness of breath, cardiac arrhythmias, dizziness, blood pressure fluctuations, myocardial infarction, stroke/transient ischemic attack, nausea, vomiting, allergic reaction, radiation exposure, metallic taste sensation and life-threatening complications (estimated to be 1 in 10,000)], benefits (risk stratification, diagnosing coronary artery disease, treatment guidance) and alternatives of a nuclear stress test were discussed in detail with [name] and she agrees to proceed.       Medication Adjustments/Labs and Tests Ordered: Current medicines are reviewed at length with the patient today.  Concerns regarding medicines are outlined above.  Orders  Placed This Encounter  Procedures  . CT CARDIAC SCORING  . MYOCARDIAL PERFUSION IMAGING  . EKG 12-Lead  . ECHOCARDIOGRAM COMPLETE  . VAS [place] ABI WITH/WO TBI  . VAS [place] LOWER EXTREMITY ARTERIAL DUPLEX   No orders of the defined types were placed in this encounter.   Patient Instructions  Medication Instructions:  Your physician recommends that you continue on your current medications as directed. Please refer to the Current Medication list given to you today.  Testing/Procedures: Your physician has requested that you have a lexiscan myoview. For further information please visit [email or web address]. Please follow instruction sheet, as given.  Your physician has requested that you have an echocardiogram. Echocardiography is a painless test that uses sound waves to create images of your heart. It provides your doctor with information about the size and shape of your heart and how well your heart's chambers and valves are working. This procedure takes approximately one hour. There are no restrictions for this procedure. This will be done at our [facility] location:  [address]  .Your physician has requested that you have an ankle brachial index (ABI). During this test an ultrasound and blood pressure cuff are used to evaluate the arteries that supply the arms and legs with blood. Allow thirty minutes for this exam. There are no restrictions or special instructions.  CT coronary calcium score. This test is done at [address]. This is $150  out of pocket.   Coronary CalciumScan A coronary calcium scan is an imaging test used to look for deposits of calcium and other fatty materials (plaques) in the inner lining of the blood vessels of the heart (coronary arteries). These deposits of calcium and plaques can partly clog and narrow the coronary arteries without producing any symptoms or warning signs. This puts a person at risk for a heart attack. This test  can detect these deposits before symptoms develop. Tell a health care provider about:  Any allergies you have.  All medicines you are taking, including vitamins, herbs, eye drops, creams, and over-the-counter medicines.  Any problems you or family members have had with anesthetic medicines.  Any blood disorders you have.  Any surgeries you have had.  Any medical conditions you have.  Whether you are pregnant or may be pregnant. What are the risks? Generally, this is a safe procedure. However, problems may occur, including:  Harm to a pregnant woman and her unborn baby. This test involves the use of radiation. Radiation exposure can be dangerous to a pregnant woman and her unborn baby. If you are pregnant, you generally should not have this procedure done.  Slight increase in the risk of cancer. This is because of the radiation involved in the test. What happens before the procedure? No preparation is needed for this procedure. What happens during the procedure?  You will undress and remove any jewelry around your neck or chest.  You will put on a hospital gown.  Sticky electrodes will be placed on your chest. The electrodes will be connected to an electrocardiogram (ECG) machine to record a tracing of the electrical activity of your heart.  A CT scanner will take pictures of your heart. During this time, you will be asked to lie still and hold your breath for 2-3 seconds while a picture of your heart is being taken. The procedure may vary among health care providers and hospitals. What happens after the procedure?  You can get dressed.  You can return to your normal activities.  It is up to you to get the results of your test. Ask your health care provider, or the department that is doing the test, when your results will be ready. Summary  A coronary calcium scan is an imaging test used to look for deposits of calcium and other fatty materials (plaques) in the inner lining  of the blood vessels of the heart (coronary arteries).  Generally, this is a safe procedure. Tell your health care provider if you are pregnant or may be pregnant.  No preparation is needed for this procedure.  A CT scanner will take pictures of your heart.  You can return to your normal activities after the scan is done. This information is not intended to replace advice given to you by your health care provider. Make sure you discuss any questions you have with your health care provider. Document Released: 08/24/2007 Document Revised: 01/15/2016 Document Reviewed: 01/15/2016 Elsevier Interactive Patient Education  2017 [place]: At [facility], you and your health needs are our priority.  As part of our continuing mission to provide you with exceptional heart care, we have created designated Provider Care Teams.  These Care Teams include your primary Cardiologist (physician) and Advanced Practice Providers (APPs -  Physician Assistants and Nurse Practitioners) who all work together to provide you with the care you need, when you need it.  We recommend signing up for the patient portal called "MyChart".  Sign up information is provided on this After Visit Summary.  MyChart is used to connect with patients for Virtual Visits (Telemedicine).  Patients are able to view lab/test results, encounter notes, upcoming appointments, etc.  Non-urgent messages can be sent to your provider as well.   To learn more about what you can do with MyChart, go to [email or web address].    Your next appointment:   3 month(s)  The format for your next appointment:   In Person  Provider:   Oswaldo Milian, MD        Signed, Donato Heinz, MD  01/31/2020 10:48 PM    [place]

## 2020-01-31 ENCOUNTER — Ambulatory Visit (INDEPENDENT_AMBULATORY_CARE_PROVIDER_SITE_OTHER): Payer: PPO | Admitting: Cardiology

## 2020-01-31 ENCOUNTER — Encounter: Payer: Self-pay | Admitting: Cardiology

## 2020-01-31 VITALS — BP 129/80 | HR 64 | Ht 68.0 in | Wt 171.2 lb

## 2020-01-31 DIAGNOSIS — M79605 Pain in left leg: Secondary | ICD-10-CM

## 2020-01-31 DIAGNOSIS — I341 Nonrheumatic mitral (valve) prolapse: Secondary | ICD-10-CM | POA: Diagnosis not present

## 2020-01-31 DIAGNOSIS — M79604 Pain in right leg: Secondary | ICD-10-CM | POA: Diagnosis not present

## 2020-01-31 DIAGNOSIS — R079 Chest pain, unspecified: Secondary | ICD-10-CM

## 2020-01-31 DIAGNOSIS — E785 Hyperlipidemia, unspecified: Secondary | ICD-10-CM | POA: Diagnosis not present

## 2020-01-31 DIAGNOSIS — I10 Essential (primary) hypertension: Secondary | ICD-10-CM

## 2020-01-31 NOTE — Patient Instructions (Signed)
Medication Instructions:  Your physician recommends that you continue on your current medications as directed. Please refer to the Current Medication list given to you today.  Testing/Procedures: Your physician has requested that you have a lexiscan myoview. For further information please visit [email or web address]. Please follow instruction sheet, as given.  Your physician has requested that you have an echocardiogram. Echocardiography is a painless test that uses sound waves to create images of your heart. It provides your doctor with information about the size and shape of your heart and how well your heart's chambers and valves are working. This procedure takes approximately one hour. There are no restrictions for this procedure. This will be done at our [facility] location:  [address]  .Your physician has requested that you have an ankle brachial index (ABI). During this test an ultrasound and blood pressure cuff are used to evaluate the arteries that supply the arms and legs with blood. Allow thirty minutes for this exam. There are no restrictions or special instructions.  CT coronary calcium score. This test is done at [address]. This is $150 out of pocket.   Coronary CalciumScan A coronary calcium scan is an imaging test used to look for deposits of calcium and other fatty materials (plaques) in the inner lining of the blood vessels of the heart (coronary arteries). These deposits of calcium and plaques can partly clog and narrow the coronary arteries without producing any symptoms or warning signs. This puts a person at risk for a heart attack. This test can detect these deposits before symptoms develop. Tell a health care provider about:  Any allergies you have.  All medicines you are taking, including vitamins, herbs, eye drops, creams, and over-the-counter medicines.  Any problems you or family members have had with anesthetic  medicines.  Any blood disorders you have.  Any surgeries you have had.  Any medical conditions you have.  Whether you are pregnant or may be pregnant. What are the risks? Generally, this is a safe procedure. However, problems may occur, including:  Harm to a pregnant woman and her unborn baby. This test involves the use of radiation. Radiation exposure can be dangerous to a pregnant woman and her unborn baby. If you are pregnant, you generally should not have this procedure done.  Slight increase in the risk of cancer. This is because of the radiation involved in the test. What happens before the procedure? No preparation is needed for this procedure. What happens during the procedure?  You will undress and remove any jewelry around your neck or chest.  You will put on a hospital gown.  Sticky electrodes will be placed on your chest. The electrodes will be connected to an electrocardiogram (ECG) machine to record a tracing of the electrical activity of your heart.  A CT scanner will take pictures of your heart. During this time, you will be asked to lie still and hold your breath for 2-3 seconds while a picture of your heart is being taken. The procedure may vary among health care providers and hospitals. What happens after the procedure?  You can get dressed.  You can return to your normal activities.  It is up to you to get the results of your test. Ask your health care provider, or the department that is doing the test, when your results will be ready. Summary  A coronary calcium scan is an imaging test used to look for deposits of calcium and other  fatty materials (plaques) in the inner lining of the blood vessels of the heart (coronary arteries).  Generally, this is a safe procedure. Tell your health care provider if you are pregnant or may be pregnant.  No preparation is needed for this procedure.  A CT scanner will take pictures of your heart.  You can return to  your normal activities after the scan is done. This information is not intended to replace advice given to you by your health care provider. Make sure you discuss any questions you have with your health care provider. Document Released: 08/24/2007 Document Revised: 01/15/2016 Document Reviewed: 01/15/2016 Elsevier Interactive Patient Education  2017 [place]: At [facility], you and your health needs are our priority.  As part of our continuing mission to provide you with exceptional heart care, we have created designated Provider Care Teams.  These Care Teams include your primary Cardiologist (physician) and Advanced Practice Providers (APPs -  Physician Assistants and Nurse Practitioners) who all work together to provide you with the care you need, when you need it.  We recommend signing up for the patient portal called "MyChart".  Sign up information is provided on this After Visit Summary.  MyChart is used to connect with patients for Virtual Visits (Telemedicine).  Patients are able to view lab/test results, encounter notes, upcoming appointments, etc.  Non-urgent messages can be sent to your provider as well.   To learn more about what you can do with MyChart, go to [email or web address].    Your next appointment:   3 month(s)  The format for your next appointment:   In Person  Provider:   Oswaldo Milian, MD

## 2020-02-09 NOTE — Addendum Note (Signed)
Addended by: Oswaldo Milian on: 02/09/2020 08:43 PM   Modules accepted: Orders

## 2020-02-09 NOTE — Addendum Note (Signed)
Addended by: Patria Mane A on: 02/09/2020 01:49 PM   Modules accepted: Orders

## 2020-02-14 DIAGNOSIS — J019 Acute sinusitis, unspecified: Secondary | ICD-10-CM | POA: Diagnosis not present

## 2020-02-14 DIAGNOSIS — Z20828 Contact with and (suspected) exposure to other viral communicable diseases: Secondary | ICD-10-CM | POA: Diagnosis not present

## 2020-02-17 ENCOUNTER — Encounter (HOSPITAL_COMMUNITY): Payer: PPO

## 2020-02-24 ENCOUNTER — Telehealth (HOSPITAL_COMMUNITY): Payer: Self-pay | Admitting: *Deleted

## 2020-02-24 NOTE — Telephone Encounter (Signed)
Patient given detailed instructions per Myocardial Perfusion Study Information Sheet for the test on 03/01/20. Patient notified to arrive 15 minutes early and that it is imperative to arrive on time for appointment to keep from having the test rescheduled.  If you need to cancel or reschedule your appointment, please call the office within 24 hours of your appointment. . Patient verbalized understanding. Kirstie Peri

## 2020-02-28 ENCOUNTER — Telehealth: Payer: Self-pay | Admitting: Cardiology

## 2020-02-28 NOTE — Telephone Encounter (Signed)
New Message:3      Pt is having Stress Test on Wednesday, question about taking her medicine.

## 2020-02-28 NOTE — Telephone Encounter (Signed)
Spoke to patient . She use a wood burning stove and  Needs to take sudafed and Delsem  For sinus.  Patient wanted to know if it will interfere with Myocardial Test  On 03/01/20.  Rn informed patient it will not interfere  , information obtained from  Staff in nuclear department Patient verbalized understanding

## 2020-02-29 ENCOUNTER — Telehealth (HOSPITAL_COMMUNITY): Payer: Self-pay

## 2020-02-29 NOTE — Telephone Encounter (Signed)
Spoke with the patient, detailed instructions given. She stated that she understood and would be here for her test. Asked to call back with any questions. S.Avereigh Spainhower EMTP 

## 2020-03-01 ENCOUNTER — Ambulatory Visit (INDEPENDENT_AMBULATORY_CARE_PROVIDER_SITE_OTHER)
Admission: RE | Admit: 2020-03-01 | Discharge: 2020-03-01 | Disposition: A | Discharge status: Home / Self Care | Payer: Self-pay | Source: Ambulatory Visit | Attending: Cardiology | Admitting: Cardiology

## 2020-03-01 ENCOUNTER — Encounter (HOSPITAL_COMMUNITY): Payer: PPO

## 2020-03-01 ENCOUNTER — Ambulatory Visit (HOSPITAL_BASED_OUTPATIENT_CLINIC_OR_DEPARTMENT_OTHER): Payer: PPO

## 2020-03-01 ENCOUNTER — Other Ambulatory Visit: Payer: Self-pay

## 2020-03-01 ENCOUNTER — Ambulatory Visit (HOSPITAL_COMMUNITY)
Admission: RE | Admit: 2020-03-01 | Discharge: 2020-03-01 | Disposition: A | Discharge status: Home / Self Care | Payer: PPO | Source: Ambulatory Visit | Attending: Cardiovascular Disease | Admitting: Cardiovascular Disease

## 2020-03-01 DIAGNOSIS — I341 Nonrheumatic mitral (valve) prolapse: Secondary | ICD-10-CM | POA: Diagnosis not present

## 2020-03-01 DIAGNOSIS — M79605 Pain in left leg: Secondary | ICD-10-CM | POA: Diagnosis not present

## 2020-03-01 DIAGNOSIS — M79604 Pain in right leg: Secondary | ICD-10-CM | POA: Insufficient documentation

## 2020-03-01 DIAGNOSIS — E785 Hyperlipidemia, unspecified: Secondary | ICD-10-CM

## 2020-03-01 LAB — ECHOCARDIOGRAM COMPLETE
Area-P 1/2: 2.81 cm2
S' Lateral: 2.4 cm

## 2020-03-02 ENCOUNTER — Other Ambulatory Visit: Payer: Self-pay | Admitting: *Deleted

## 2020-03-02 ENCOUNTER — Ambulatory Visit (HOSPITAL_COMMUNITY): Payer: PPO | Attending: Cardiology

## 2020-03-02 DIAGNOSIS — R079 Chest pain, unspecified: Secondary | ICD-10-CM | POA: Diagnosis not present

## 2020-03-02 LAB — MYOCARDIAL PERFUSION IMAGING
LV dias vol: 63 mL (ref 46–106)
LV sys vol: 19 mL
Peak HR: 96 {beats}/min
Rest HR: 49 {beats}/min
SDS: 2
SRS: 0
SSS: 2
TID: 0.89

## 2020-03-02 MED ORDER — TECHNETIUM TC 99M TETROFOSMIN IV KIT
10.7000 | PACK | Freq: Once | INTRAVENOUS | Status: AC | PRN
  Administered 2020-03-02: 10.7 via INTRAVENOUS
  Filled 2020-03-02: qty 11

## 2020-03-02 MED ORDER — ATORVASTATIN CALCIUM 80 MG PO TABS: 80.0000 mg | ORAL_TABLET | Freq: Every day | ORAL | 3 refills | Status: AC

## 2020-03-02 MED ORDER — REGADENOSON 0.4 MG/5ML IV SOLN
0.4000 mg | Freq: Once | INTRAVENOUS | Status: AC
  Administered 2020-03-02: 0.4 mg via INTRAVENOUS

## 2020-03-02 MED ORDER — TECHNETIUM TC 99M TETROFOSMIN IV KIT
31.7000 | PACK | Freq: Once | INTRAVENOUS | Status: AC | PRN
  Administered 2020-03-02: 31.7 via INTRAVENOUS
  Filled 2020-03-02: qty 32

## 2020-03-16 ENCOUNTER — Encounter: Payer: Self-pay | Admitting: *Deleted

## 2020-05-02 DIAGNOSIS — I10 Essential (primary) hypertension: Secondary | ICD-10-CM | POA: Diagnosis not present

## 2020-05-02 DIAGNOSIS — R7301 Impaired fasting glucose: Secondary | ICD-10-CM | POA: Diagnosis not present

## 2020-05-02 DIAGNOSIS — G4700 Insomnia, unspecified: Secondary | ICD-10-CM | POA: Diagnosis not present

## 2020-05-02 DIAGNOSIS — E7800 Pure hypercholesterolemia, unspecified: Secondary | ICD-10-CM | POA: Diagnosis not present

## 2020-05-02 DIAGNOSIS — I341 Nonrheumatic mitral (valve) prolapse: Secondary | ICD-10-CM | POA: Diagnosis not present

## 2020-05-02 DIAGNOSIS — K5903 Drug induced constipation: Secondary | ICD-10-CM | POA: Diagnosis not present

## 2020-05-02 DIAGNOSIS — R7303 Prediabetes: Secondary | ICD-10-CM | POA: Diagnosis not present

## 2020-05-08 NOTE — Progress Notes (Signed)
Cardiology Office Note:    Date:  05/09/2020   ID:  [name], DOB 1945-05-07, MRN [id number]  PCP:  Deland Pretty, MD  Cardiologist:  No primary care provider on file.  Electrophysiologist:  None   Referring MD: Anda Kraft, MD   Chief Complaint  Patient presents with  . Chest Pain    History of Present Illness:    [name] is a 75 y.o. female with a hx of mitral valve prolapse, hypertension, hyperlipidemia who presents for follow-up.  She was initially seen on 01/31/2020 for evaluation of chest pain.  She previously followed with Dr. Johnsie Cancel, last seen in 2012.  She had normal heart catheterization in 2010.  Stress echo in 2012 showed no evidence of ischemia, normal systolic function.  She reports she has been having chest pain that she describes as burning/tightness in center of her chest, that occurs when she is stressed.  She walks twice per week for 3.5 miles, denies any exertional chest pain.  Chest pain only occurs when she is under significant stress.  States that this past year was homeschooling her 1 year old granddaughter and would frequently have the chest pain at that time.  Reports episodes can last for up to 2 minutes.  Does report a history of esophageal spasm as well.  Also has been having rare palpitations.  Denies any lower extremity edema.  Reports occasional lightheadedness that has been attributed to vertigo, denies any syncope.  Does report she has been having burning/numbness in her legs.  No smoking history.  Family history includes mother died of MI at age 48, father had MI in 25s, and brother died of MI in 70s.  Echocardiogram on 03/01/2020 shows normal biventricular function, no significant valvular disease.  ABIs on 03/01/2020 were normal.  Lexiscan Myoview on 03/02/2020 was normal.  Coronary calcium score of 71 (62nd percentile) on 03/01/2020.  Since last clinic visit, she reports that she is doing well.  Reports rare chest pain lasting a few seconds.   Has attributed symptoms to stress.  Denies any dyspnea.  Reports rare palpitaions lasting for few seconds where feels like heart is fluttering.  Denies any syncope but reports he has been having lightheadedness.  Has been monitor her heart rate, has noted his been down to 40s.  States that she does feel lightheaded when he gets that low.  She has lost 20 pounds since starting Ozempic.  Denies any lower extremity edema.    Past Medical History:  Diagnosis Date  . Anxiety state, unspecified   . CHEST PAIN UNSPECIFIED   . Essential hypertension, benign   . MITRAL VALVE PROLAPSE   . Mixed hyperlipidemia       Current Medications: Current Meds  Medication Sig  . atorvastatin (LIPITOR) 80 MG tablet Take 1 tablet (80 mg total) by mouth at bedtime.  . cetirizine (ZYRTEC) 10 MG tablet   . Cholecalciferol (VITAMIN D3) 25 MCG (1000 UT) CAPS   . hydrochlorothiazide (HYDRODIURIL) 12.5 MG tablet   . loratadine (CLARITIN) 10 MG tablet   . LORazepam (ATIVAN PO) as needed.  Marland Kitchen losartan (COZAAR) 100 MG tablet   . Multiple Vitamins-Minerals (CENTRUM SILVER PO) 1 tab po qd  . omeprazole (PRILOSEC) 20 MG capsule   . OZEMPIC, 0.25 OR 0.5 MG/DOSE, 2 MG/1.5ML SOPN Inject into the skin.  . Pseudoephedrine HCl 30 MG CAPA   . traZODone (DESYREL) 50 MG tablet TAKE 1 TO 2 TABLETS BY MOUTH ONCE DAILY AT BEDTIME AS NEEDED  . [  DISCONTINUED] metoprolol succinate (TOPROL-XL) 25 MG 24 hr tablet      Allergies:   Bactrim [sulfamethoxazole-trimethoprim], Codeine, Minocycline hcl, and Cefdinir   Social History   Socioeconomic History  . Marital status: Married    Spouse name: Not on file  . Number of children: Not on file  . Years of education: Not on file  . Highest education level: Not on file  Occupational History  . Not on file  Tobacco Use  . Smoking status: Never Smoker  . Smokeless tobacco: Never Used  Substance and Sexual Activity  . Alcohol use: No  . Drug use: No  . Sexual activity: Yes  Other  Topics Concern  . Not on file  Social History Narrative     Lives in [place], [place] with her husband.        She is employed with a [employer] in [place].  She is a        nonsmoker, nondrinker and has moderate to heavy caffeine intake in the        form of sodas.  (07/06/2008)   Social Determinants of Health   Financial Resource Strain: Not on file  Food Insecurity: Not on file  Transportation Needs: Not on file  Physical Activity: Not on file  Stress: Not on file  Social Connections: Not on file     Family History: The patient's family history includes Coronary artery disease in an other family member.  ROS:   Please see the history of present illness.     All other systems reviewed and are negative.  EKGs/Labs/Other Studies Reviewed:    The following studies were reviewed today:   EKG:  EKG is not ordered today.  The ekg ordered at prior visit demonstrates normal sinus rhythm, rate 64, no ST/T abnormalities  Recent Labs: 05/09/2020: BUN 14; Creatinine, Ser 0.86; Magnesium 2.1; Potassium 3.6; Sodium 140  Recent Lipid Panel    Component Value Date/Time   CHOL 136 05/09/2020 1046   TRIG 162 (H) 05/09/2020 1046   HDL 41 05/09/2020 1046   CHOLHDL 3.3 05/09/2020 1046   CHOLHDL 3.7 07/02/2008 0230   VLDL 23 07/02/2008 0230   LDLCALC 67 05/09/2020 1046    Physical Exam:    VS:  BP 125/72   Pulse 67   Ht 5\' 7"  (1.702 m)   Wt 161 lb 3.2 oz (73.1 kg)   SpO2 97%   BMI 25.25 kg/m     Wt Readings from Last 3 Encounters:  05/09/20 161 lb 3.2 oz (73.1 kg)  03/02/20 171 lb (77.6 kg)  01/31/20 171 lb 3.2 oz (77.7 kg)     GEN: Well nourished, well developed in no acute distress HEENT: Normal NECK: No JVD; No carotid bruits LYMPHATICS: No lymphadenopathy CARDIAC: RRR, 2/6 systolic murmur RESPIRATORY:  Clear to auscultation without rales, wheezing or rhonchi  ABDOMEN: Soft, non-tender, non-distended MUSCULOSKELETAL:  No edema; No deformity  SKIN:  Warm and dry NEUROLOGIC:  Alert and oriented x 3 PSYCHIATRIC:  Normal affect   ASSESSMENT:    1. Chest pain of uncertain etiology   2. Bradycardia   3. Essential hypertension   4. Hyperlipidemia, unspecified hyperlipidemia type   5. Medication management    PLAN:    Chest pain: Description concerning for angina, as describes substernal chest pressure occurring when stressed.  She had normal heart catheterization in 2010 and negative stress echo in 2012.  Echocardiogram on 03/01/2020 shows normal biventricular function, no significant valvular disease.  Lexiscan Myoview on 03/02/2020 was normal. -No further cardiac work-up recommended  Bradycardia: Reports resting heart rate in 40s to 50s and is felt lightheadedness.  Recommend discontinuing metoprolol.  Mitral valve prolapse: Reported history.  Systolic murmur on exam.  Echocardiogram on 03/01/2020 showed no evidence of mitral prolapse.  Leg pain: ABIs on 03/01/2020 were normal.    Hyperlipidemia: LDL 104 on 12/13/2019.  On atorvastatin 40 mg daily.  Coronary calcium score of 71 (62nd percentile) on 03/01/2020.  Atorvastatin dose increased to 80 mg daily.  Will recheck lipid panel  Hypertension: On hydrochlorothiazide 12.5 mg daily, Toprol-XL 25 mg daily, losartan 100 mg daily.  Appears controlled.  Will check BMP.  Discontinue metoprolol due to bradycardia as above   RTC in 1 year     Medication Adjustments/Labs and Tests Ordered: Current medicines are reviewed at length with the patient today.  Concerns regarding medicines are outlined above.  Orders Placed This Encounter  Procedures  . Lipid panel  . Basic metabolic panel  . Magnesium   No orders of the defined types were placed in this encounter.   Patient Instructions  Medication Instructions:  STOP metoprolol  *If you need a refill on your cardiac medications before your next appointment, please call your pharmacy*   Lab Work: BMET, Mag, Lipid today  If you  have labs (blood work) drawn today and your tests are completely normal, you will receive your results only by: Marland Kitchen MyChart Message (if you have MyChart) OR . A paper copy in the mail If you have any lab test that is abnormal or we need to change your treatment, we will call you to review the results.  Follow-Up: At [facility], you and your health needs are our priority.  As part of our continuing mission to provide you with exceptional heart care, we have created designated Provider Care Teams.  These Care Teams include your primary Cardiologist (physician) and Advanced Practice Providers (APPs -  Physician Assistants and Nurse Practitioners) who all work together to provide you with the care you need, when you need it.  We recommend signing up for the patient portal called "MyChart".  Sign up information is provided on this After Visit Summary.  MyChart is used to connect with patients for Virtual Visits (Telemedicine).  Patients are able to view lab/test results, encounter notes, upcoming appointments, etc.  Non-urgent messages can be sent to your provider as well.   To learn more about what you can do with MyChart, go to [email or web address].    Your next appointment:   12 month(s)  The format for your next appointment:   In Person  Provider:   Oswaldo Milian, MD        Signed, Donato Heinz, MD  05/09/2020 6:11 PM    [place]

## 2020-05-09 ENCOUNTER — Other Ambulatory Visit: Payer: Self-pay

## 2020-05-09 ENCOUNTER — Encounter: Payer: Self-pay | Admitting: Cardiology

## 2020-05-09 ENCOUNTER — Ambulatory Visit: Payer: PPO | Admitting: Cardiology

## 2020-05-09 VITALS — BP 125/72 | HR 67 | Ht 67.0 in | Wt 161.2 lb

## 2020-05-09 DIAGNOSIS — I10 Essential (primary) hypertension: Secondary | ICD-10-CM

## 2020-05-09 DIAGNOSIS — R001 Bradycardia, unspecified: Secondary | ICD-10-CM | POA: Diagnosis not present

## 2020-05-09 DIAGNOSIS — Z79899 Other long term (current) drug therapy: Secondary | ICD-10-CM

## 2020-05-09 DIAGNOSIS — E785 Hyperlipidemia, unspecified: Secondary | ICD-10-CM | POA: Diagnosis not present

## 2020-05-09 DIAGNOSIS — R079 Chest pain, unspecified: Secondary | ICD-10-CM | POA: Diagnosis not present

## 2020-05-09 LAB — BASIC METABOLIC PANEL
BUN/Creatinine Ratio: 16 (ref 12–28)
BUN: 14 mg/dL (ref 8–27)
CO2: 26 mmol/L (ref 20–29)
Calcium: 9.4 mg/dL (ref 8.7–10.3)
Chloride: 99 mmol/L (ref 96–106)
Creatinine, Ser: 0.86 mg/dL (ref 0.57–1.00)
Glucose: 89 mg/dL (ref 65–99)
Potassium: 3.6 mmol/L (ref 3.5–5.2)
Sodium: 140 mmol/L (ref 134–144)
eGFR: 72 mL/min/{1.73_m2} (ref >59)

## 2020-05-09 LAB — LIPID PANEL
Chol/HDL Ratio: 3.3 ratio (ref 0.0–4.4)
Cholesterol, Total: 136 mg/dL (ref 100–199)
HDL: 41 mg/dL (ref >39)
LDL Chol Calc (NIH): 67 mg/dL (ref 0–99)
Triglycerides: 162 mg/dL — ABNORMAL HIGH (ref 0–149)
VLDL Cholesterol Cal: 28 mg/dL (ref 5–40)

## 2020-05-09 LAB — MAGNESIUM: Magnesium: 2.1 mg/dL (ref 1.6–2.3)

## 2020-05-09 NOTE — Patient Instructions (Addendum)
Medication Instructions:  STOP metoprolol  *If you need a refill on your cardiac medications before your next appointment, please call your pharmacy*   Lab Work: BMET, Mag, Lipid today  If you have labs (blood work) drawn today and your tests are completely normal, you will receive your results only by: Marland Kitchen MyChart Message (if you have MyChart) OR . A paper copy in the mail If you have any lab test that is abnormal or we need to change your treatment, we will call you to review the results.  Follow-Up: At [facility], you and your health needs are our priority.  As part of our continuing mission to provide you with exceptional heart care, we have created designated Provider Care Teams.  These Care Teams include your primary Cardiologist (physician) and Advanced Practice Providers (APPs -  Physician Assistants and Nurse Practitioners) who all work together to provide you with the care you need, when you need it.  We recommend signing up for the patient portal called "MyChart".  Sign up information is provided on this After Visit Summary.  MyChart is used to connect with patients for Virtual Visits (Telemedicine).  Patients are able to view lab/test results, encounter notes, upcoming appointments, etc.  Non-urgent messages can be sent to your provider as well.   To learn more about what you can do with MyChart, go to [email or web address].    Your next appointment:   12 month(s)  The format for your next appointment:   In Person  Provider:   Oswaldo Milian, MD

## 2020-06-28 ENCOUNTER — Emergency Department (HOSPITAL_COMMUNITY)
Admission: EM | Admit: 2020-06-28 | Discharge: 2020-06-29 | Disposition: A | Discharge status: Home / Self Care | Payer: PPO | Attending: Emergency Medicine | Admitting: Emergency Medicine

## 2020-06-28 ENCOUNTER — Emergency Department (HOSPITAL_COMMUNITY): Payer: PPO

## 2020-06-28 ENCOUNTER — Other Ambulatory Visit: Payer: Self-pay

## 2020-06-28 ENCOUNTER — Encounter (HOSPITAL_COMMUNITY): Payer: Self-pay

## 2020-06-28 DIAGNOSIS — R0789 Other chest pain: Secondary | ICD-10-CM | POA: Diagnosis not present

## 2020-06-28 DIAGNOSIS — I10 Essential (primary) hypertension: Secondary | ICD-10-CM | POA: Diagnosis not present

## 2020-06-28 DIAGNOSIS — R079 Chest pain, unspecified: Secondary | ICD-10-CM | POA: Insufficient documentation

## 2020-06-28 LAB — CBC WITH DIFFERENTIAL/PLATELET
Abs Immature Granulocytes: 0.03 10*3/uL (ref 0.00–0.07)
Basophils Absolute: 0 10*3/uL (ref 0.0–0.1)
Basophils Relative: 0 %
Eosinophils Absolute: 0.5 10*3/uL (ref 0.0–0.5)
Eosinophils Relative: 5 %
HCT: 43.3 % (ref 36.0–46.0)
Hemoglobin: 14.3 g/dL (ref 12.0–15.0)
Immature Granulocytes: 0 %
Lymphocytes Relative: 28 %
Lymphs Abs: 2.9 10*3/uL (ref 0.7–4.0)
MCH: 30 pg (ref 26.0–34.0)
MCHC: 33 g/dL (ref 30.0–36.0)
MCV: 91 fL (ref 80.0–100.0)
Monocytes Absolute: 0.5 10*3/uL (ref 0.1–1.0)
Monocytes Relative: 5 %
Neutro Abs: 6.3 10*3/uL (ref 1.7–7.7)
Neutrophils Relative %: 62 %
Platelets: 276 10*3/uL (ref 150–400)
RBC: 4.76 MIL/uL (ref 3.87–5.11)
RDW: 12.7 % (ref 11.5–15.5)
WBC: 10.3 10*3/uL (ref 4.0–10.5)
nRBC: 0 % (ref 0.0–0.2)

## 2020-06-28 NOTE — ED Triage Notes (Signed)
Chest pain radiating to the back, sharp and burning. And took 2 baby aspirin at home. Started at 2210 and lasted for about 67mins. Hx of heart murmur.   Denies any shortness of breath and dizziness.

## 2020-06-28 NOTE — ED Triage Notes (Signed)
Emergency Medicine Provider Triage Evaluation Note  [name] , a 75 y.o. female  was evaluated in triage.  Pt w/ hx of hyperlipidemia, anxiety, mitral valve prolapse, and hypertension presents to the ED with complaints of chest pain that began @ 22:10, felt like a burning/twinging pain centrally and some to the back. Took extra of her BP med & 2 baby aspirin. Pain lasted < 30 minutes. No pain @ present. Hx of somewhat similar 10+ years ago, but never this severe.     Review of Systems  Positive: Chest pain (resolved @ present) Negative: Diaphoresis, nausea, vomiting, dyspnea, syncope, dizziness.   Physical Exam  BP (!) 178/90 (BP Location: Right Arm)   Pulse 77   Temp 98.8 F (37.1 C)   Resp 16   SpO2 99%  Gen:   Awake, no distress   HEENT:  Atraumatic  Resp:  Normal effort  Cardiac:  Normal rate  Abd:   Nondistended, nontender  MSK:   Moves extremities without difficulty  Neuro:  Speech clear   Medical Decision Making  Medically screening exam initiated at 11:19 PM.  Appropriate orders placed.  [name] was informed that the remainder of the evaluation will be completed by another provider, this initial triage assessment does not replace that evaluation, and the importance of remaining in the ED until their evaluation is complete. Patient also informed to alert staff of return of pain or any new/worsening symptoms.    Clinical Impression  Chest pain    Amaryllis Dyke, PA-C 06/28/20 2326

## 2020-06-29 DIAGNOSIS — R0789 Other chest pain: Secondary | ICD-10-CM | POA: Diagnosis not present

## 2020-06-29 LAB — COMPREHENSIVE METABOLIC PANEL
ALT: 18 U/L (ref 0–44)
AST: 22 U/L (ref 15–41)
Albumin: 4.1 g/dL (ref 3.5–5.0)
Alkaline Phosphatase: 84 U/L (ref 38–126)
Anion gap: 9 (ref 5–15)
BUN: 15 mg/dL (ref 8–23)
CO2: 28 mmol/L (ref 22–32)
Calcium: 9.4 mg/dL (ref 8.9–10.3)
Chloride: 104 mmol/L (ref 98–111)
Creatinine, Ser: 0.81 mg/dL (ref 0.44–1.00)
GFR, Estimated: >60 mL/min (ref >60)
Glucose, Bld: 121 mg/dL — ABNORMAL HIGH (ref 70–99)
Potassium: 4.3 mmol/L (ref 3.5–5.1)
Sodium: 141 mmol/L (ref 135–145)
Total Bilirubin: 0.4 mg/dL (ref 0.3–1.2)
Total Protein: 6.8 g/dL (ref 6.5–8.1)

## 2020-06-29 LAB — TROPONIN I (HIGH SENSITIVITY)
Troponin I (High Sensitivity): 5 ng/L (ref <18)
Troponin I (High Sensitivity): 7 ng/L (ref <18)

## 2020-06-29 NOTE — ED Provider Notes (Signed)
[facility] Emergency Department Provider Note MRN:  [id number]  Arrival date & time: 06/29/20     Chief Complaint   Chest Pain   History of Present Illness   [name] is a 75 y.o. year-old female with a history of anxiety presenting to the ED with chief complaint of chest pain.  Location: Central chest Duration: Began at 10 PM Onset: Sudden Timing: Constant Description: Burning Severity: Moderate Exacerbating/Alleviating Factors: None Associated Symptoms: Denies dizziness or diaphoresis, no nausea vomiting, no trouble breathing, no leg pain or swelling    Review of Systems  A complete 10 system review of systems was obtained and all systems are negative except as noted in the HPI and PMH.   Patient's Health History    Past Medical History:  Diagnosis Date  . Anxiety state, unspecified   . CHEST PAIN UNSPECIFIED   . Essential hypertension, benign   . MITRAL VALVE PROLAPSE   . Mixed hyperlipidemia     Past Surgical History:  Procedure Laterality Date  . APPENDECTOMY    . CARDIAC CATHETERIZATION    . SPINE SURGERY    . TUBAL LIGATION      Family History  Problem Relation Age of Onset  . Coronary artery disease Other     Social History   Socioeconomic History  . Marital status: Married    Spouse name: Not on file  . Number of children: Not on file  . Years of education: Not on file  . Highest education level: Not on file  Occupational History  . Not on file  Tobacco Use  . Smoking status: Never Smoker  . Smokeless tobacco: Never Used  Substance and Sexual Activity  . Alcohol use: No  . Drug use: No  . Sexual activity: Yes  Other Topics Concern  . Not on file  Social History Narrative     Lives in [place], [place] with her husband.        She is employed with a [employer] in [place].  She is a        nonsmoker, nondrinker and has moderate to heavy caffeine intake in the        form of sodas.   (07/06/2008)   Social Determinants of Health   Financial Resource Strain: Not on file  Food Insecurity: Not on file  Transportation Needs: Not on file  Physical Activity: Not on file  Stress: Not on file  Social Connections: Not on file  Intimate Partner Violence: Not on file     Physical Exam   Vitals:   06/29/20 0247 06/29/20 0248  BP: (!) 166/71 (!) 166/71  Pulse: 70 67  Resp: 20 11  Temp:    SpO2: 100% 99%    CONSTITUTIONAL: Well-appearing, NAD NEURO:  Alert and oriented x 3, no focal deficits EYES:  eyes equal and reactive ENT/NECK:  no LAD, no JVD CARDIO: Regular rate, well-perfused, normal S1 and S2 PULM:  CTAB no wheezing or rhonchi GI/GU:  normal bowel sounds, non-distended, non-tender MSK/SPINE:  No gross deformities, no edema SKIN:  no rash, atraumatic PSYCH:  Appropriate speech and behavior  *Additional and/or pertinent findings included in MDM below  Diagnostic and Interventional Summary    EKG Interpretation  Date/Time:  Thursday June 29 2020 02:47:18 EDT Ventricular Rate:  70 PR Interval:  163 QRS Duration: 86 QT Interval:  437 QTC Calculation: 472 R Axis:   77 Text Interpretation: Sinus rhythm Confirmed by Gerlene Fee [phone])  on 06/29/2020 3:31:11 AM      Labs Reviewed  COMPREHENSIVE METABOLIC PANEL - Abnormal; Notable for the following components:      Result Value   Glucose, Bld 121 (*)    All other components within normal limits  CBC WITH DIFFERENTIAL/PLATELET  TROPONIN I (HIGH SENSITIVITY)  TROPONIN I (HIGH SENSITIVITY)    DG Chest 2 View    (Results Pending)    Medications - No data to display   Procedures  /  Critical Care Procedures  ED Course and Medical Decision Making  I have reviewed the triage vital signs, the nursing notes, and pertinent available records from the EMR.  Listed above are laboratory and imaging tests that I personally ordered, reviewed, and interpreted and then considered in my medical decision making  (see below for details).  Burning chest pain that has spontaneously resolved, no complaints on my evaluation, normal vital signs.  EKG is without ischemic features, troponin is negative x2.  Favoring GI etiology, doubt emergent process.  Nothing to suggest PE.  Appropriate for discharge.       Barth Kirks. [place]onia Small, [place] [email or web address]  Final Clinical Impressions(s) / ED Diagnoses     ICD-10-CM   1. Chest pain, unspecified type  R07.9     ED Discharge Orders    None       Discharge Instructions Discussed with and Provided to Patient:     Discharge Instructions     You were evaluated in the Emergency Department and after careful evaluation, we did not find any emergent condition requiring admission or further testing in the hospital.  Your exam/testing today was overall reassuring.  Please return to the Emergency Department if you experience any worsening of your condition.  Thank you for allowing [place] to be a part of your care.       Maudie Flakes, MD 06/29/20 [phone]

## 2020-06-29 NOTE — Discharge Instructions (Addendum)
You were evaluated in the Emergency Department and after careful evaluation, we did not find any emergent condition requiring admission or further testing in the hospital.  Your exam/testing today was overall reassuring.  Please return to the Emergency Department if you experience any worsening of your condition.  Thank you for allowing us to be a part of your care.  

## 2020-06-29 NOTE — ED Notes (Signed)
Patient verbalized understanding of discharge instructions. Opportunity for questions and answers.  

## 2020-08-08 DIAGNOSIS — K5903 Drug induced constipation: Secondary | ICD-10-CM | POA: Diagnosis not present

## 2020-08-08 DIAGNOSIS — R7301 Impaired fasting glucose: Secondary | ICD-10-CM | POA: Diagnosis not present

## 2020-08-08 DIAGNOSIS — I341 Nonrheumatic mitral (valve) prolapse: Secondary | ICD-10-CM | POA: Diagnosis not present

## 2020-08-08 DIAGNOSIS — I10 Essential (primary) hypertension: Secondary | ICD-10-CM | POA: Diagnosis not present

## 2020-08-08 DIAGNOSIS — E7800 Pure hypercholesterolemia, unspecified: Secondary | ICD-10-CM | POA: Diagnosis not present

## 2020-08-08 DIAGNOSIS — R7303 Prediabetes: Secondary | ICD-10-CM | POA: Diagnosis not present

## 2020-08-08 DIAGNOSIS — G4700 Insomnia, unspecified: Secondary | ICD-10-CM | POA: Diagnosis not present

## 2020-11-20 DIAGNOSIS — H903 Sensorineural hearing loss, bilateral: Secondary | ICD-10-CM | POA: Diagnosis not present

## 2020-12-01 DIAGNOSIS — H903 Sensorineural hearing loss, bilateral: Secondary | ICD-10-CM | POA: Diagnosis not present

## 2020-12-25 DIAGNOSIS — I10 Essential (primary) hypertension: Secondary | ICD-10-CM | POA: Diagnosis not present

## 2020-12-25 DIAGNOSIS — E7800 Pure hypercholesterolemia, unspecified: Secondary | ICD-10-CM | POA: Diagnosis not present

## 2020-12-25 DIAGNOSIS — R7303 Prediabetes: Secondary | ICD-10-CM | POA: Diagnosis not present

## 2021-01-01 DIAGNOSIS — I700 Atherosclerosis of aorta: Secondary | ICD-10-CM | POA: Diagnosis not present

## 2021-01-01 DIAGNOSIS — K200 Eosinophilic esophagitis: Secondary | ICD-10-CM | POA: Diagnosis not present

## 2021-01-01 DIAGNOSIS — Z23 Encounter for immunization: Secondary | ICD-10-CM | POA: Diagnosis not present

## 2021-01-01 DIAGNOSIS — R3129 Other microscopic hematuria: Secondary | ICD-10-CM | POA: Diagnosis not present

## 2021-01-01 DIAGNOSIS — I10 Essential (primary) hypertension: Secondary | ICD-10-CM | POA: Diagnosis not present

## 2021-01-01 DIAGNOSIS — K148 Other diseases of tongue: Secondary | ICD-10-CM | POA: Diagnosis not present

## 2021-01-01 DIAGNOSIS — Z0000 Encounter for general adult medical examination without abnormal findings: Secondary | ICD-10-CM | POA: Diagnosis not present

## 2021-01-01 DIAGNOSIS — R002 Palpitations: Secondary | ICD-10-CM | POA: Diagnosis not present

## 2021-01-20 DIAGNOSIS — Z1231 Encounter for screening mammogram for malignant neoplasm of breast: Secondary | ICD-10-CM | POA: Diagnosis not present

## 2021-01-23 DIAGNOSIS — R001 Bradycardia, unspecified: Secondary | ICD-10-CM | POA: Diagnosis not present

## 2021-01-25 DIAGNOSIS — R002 Palpitations: Secondary | ICD-10-CM | POA: Diagnosis not present

## 2021-01-25 DIAGNOSIS — R001 Bradycardia, unspecified: Secondary | ICD-10-CM | POA: Diagnosis not present

## 2021-01-29 DIAGNOSIS — Z01419 Encounter for gynecological examination (general) (routine) without abnormal findings: Secondary | ICD-10-CM | POA: Diagnosis not present

## 2021-02-07 DIAGNOSIS — I700 Atherosclerosis of aorta: Secondary | ICD-10-CM | POA: Diagnosis not present

## 2021-02-07 DIAGNOSIS — R7989 Other specified abnormal findings of blood chemistry: Secondary | ICD-10-CM | POA: Diagnosis not present

## 2021-02-07 DIAGNOSIS — E7800 Pure hypercholesterolemia, unspecified: Secondary | ICD-10-CM | POA: Diagnosis not present

## 2021-02-07 DIAGNOSIS — I10 Essential (primary) hypertension: Secondary | ICD-10-CM | POA: Diagnosis not present

## 2021-02-14 DIAGNOSIS — Z8739 Personal history of other diseases of the musculoskeletal system and connective tissue: Secondary | ICD-10-CM | POA: Diagnosis not present

## 2021-02-14 DIAGNOSIS — K148 Other diseases of tongue: Secondary | ICD-10-CM | POA: Diagnosis not present

## 2021-02-14 DIAGNOSIS — K200 Eosinophilic esophagitis: Secondary | ICD-10-CM | POA: Diagnosis not present

## 2021-02-22 NOTE — Progress Notes (Signed)
Office Visit    Patient Name: SIYA FLURRY Date of Encounter: 02/23/2021  PCP:  Deland Pretty, [facility]  Cardiologist:  Donato Heinz, MD  Advanced Practice Provider:  No care team member to display Electrophysiologist:  None    Chief Complaint    [name] is a 75 y.o. female with a hx of mitral valve prolapse, hypertension, hyperlipidemia presents today for annual follow-up.  Past Medical History    Past Medical History:  Diagnosis Date   Anxiety state, unspecified    CHEST PAIN UNSPECIFIED    Essential hypertension, benign    MITRAL VALVE PROLAPSE    Mixed hyperlipidemia    Past Surgical History:  Procedure Laterality Date   APPENDECTOMY     CARDIAC CATHETERIZATION     SPINE SURGERY     TUBAL LIGATION      Allergies  Allergies  Allergen Reactions   Bactrim [Sulfamethoxazole-Trimethoprim]     INTOLERANCE NAUSEA   Codeine    Minocycline Hcl    Cefdinir Hives, Itching and Rash    History of Present Illness    NATAYA BASTEDO is a 75 y.o. female with a hx of hx of mitral valve prolapse, hypertension, hyperlipidemia presents today for follow-up with discussion of monitor results last seen March 2022 by Dr. Gardiner Rhyme.   The patient was initially seen 01/31/2020 for evaluation of chest pain.  She was previously followed by Dr. Johnsie Cancel.  She had a normal heart catheterization in 2010.  Stress echocardiogram in 2012 showed no evidence of ischemia, normal systolic function.  She reported having chest pain that she described as burning/tightness in the center of her chest, that occurs when she is stressed.  She walks twice per week for 3.5 miles and denies any exertional chest pain.  Chest pain only occurs when she is under significant stress.  She states that this past year was homeschooling her 74 year old granddaughter and would frequently have the chest pain at that time.  She reports episodes can last for up to 2 minutes.  She  did also report a history of esophageal spasm as well.  Also, she has had rare palpitations.  She denied any lower extremity edema.  She reported occasional lightheadedness that was attributed to vertigo, denied any syncope.  She did report at that time having burning/numbness in her legs.  She has no previous smoking history.  She does have positive family history for coronary artery disease which includes her mother passing away at 33 from an MI and her father had an MI in his 37s.  Also her brother died in his 48s of an MI.  Echocardiogram last December showed normal biventricular function, no significant valvular disease.  ABIs on 03/01/2020 were normal.  Lexiscan Myoview done last December was normal.  Coronary calcium score of 71 (62nd percentile) done at this time as well.  She has been doing well at her last clinic visit back in March.  She reported rare chest pain lasting a few seconds.  She denied any dyspnea.  She reports rare palpitations lasting for few seconds where her heart feels like it is fluttering.  She denies any syncope but reports she has been having lightheadedness.  She has been monitoring her heart rate and noted it has been down to in the 40s.  She states that she does not feel lightheaded when it gets that low.  She had lost 20 pounds since starting on Ozempic.  She denied any lower extremity edema at that time.  No further work-up was recommended regarding her atypical chest pain.  Her metoprolol was discontinued for sinus bradycardia.  Her atorvastatin was increased to 80 mg daily and a lipid panel was rechecked which showed triglycerides 162, LDL 67, HDL 41, total cholesterol 136.  She presented to the ED in April 2022 for chest pain.  She described the pain as burning in the center of her chest.  It spontaneously resolved.  She had normal vital signs.  EKG was without ischemic features, troponin is negative x2.  GI etiology was favored.  There was no signs or symptoms suggestive  of a PE.  She was discharged shortly after.  Today, she presents today to review her monitor results.  She had 16 episodes of SVT over 14 days.  The fastest interval lasting 6 beats with a max heart rate of 184.  She reported symptoms during 1 of these episodes.  She did have some sinus bradycardia with lowest heart rate of 36 bpm which occurred at night.  She is not symptomatic with her bradycardia.  She does describe what she calls "vertigo".  This is not the typical spinning feeling but more of a lightheaded dizziness when changing positions.  It sounds more like an orthostatic hypotension than a true vertigo.  She also states that when she has fluid in her in her ear this gets worse.  She is on 2 different allergy medications Zyrtec and Claritin to help prevent any excess fluid.  She also was taking as needed Lasix 20 mg as needed for fluid overload however she ran out of this medication so we will refill it today.  She walks 3 miles 4 days a week with her husband.  She does state that she has had intermittent chest pain that she feels is cardiac but it only occurs every couple of months and lasts for seconds during periods of high stress.  She has frequent episodes of esophagitis which feels like her food gets stuck and this is a different pain entirely.  She takes daily Prilosec.  She recently had a CBC and CMP done in October and her blood count and electrolytes looked normal.  They did not get a magnesium level so we will order that today and we will also order a TSH to check her thyroid.  Last time that she had any severe chest pain was back when she was in the ED in April and she describes the pain as sharp and twingy.  Her blood pressure was increased during that ED visit but has been well controlled since.  At that time the pain also radiated up her left neck to her jaw.  It resolved while she was in the ED and troponin levels and EKG were negative.  She had a full cardiac work-up a year ago with a  Myoview stress test and echocardiogram which were both normal.  She has a remote history of mitral valve prolapse but this was not seen on her echocardiogram last year and she does not have a murmur on exam today.  She has limited her caffeine intake to only decaf tea and rarely takes Sudafed for congestion.  We discussed Sudafed effect on the heart.  I do not think it would be wise starting her back on a beta-blocker due to her baseline bradycardia.  She does however have an extensive family history of coronary artery disease.  Her mother had angina in her 45s and  subsequently an MI in her 37s.  Her father died in his 43s after aortic aneurysm repair.  Her brother had an MI in his 76s and subsequently died at age 92.  I would have a low threshold to order a cardiac CT if she felt her chest pain was becoming more frequent and more severe.  Reports no shortness of breath nor dyspnea on exertion.  No edema, orthopnea, PND.     EKGs/Labs/Other Studies Reviewed:   The following studies were reviewed today:  03/01/2020 Echocardiogram  IMPRESSIONS     1. Left ventricular ejection fraction, by estimation, is 65 to 70%. The  left ventricle has normal function.   2. The left ventricle has no regional wall motion abnormalities.   3. Left ventricular diastolic parameters are consistent with Grade I  diastolic dysfunction (impaired relaxation).   4. The average left ventricular global longitudinal strain is -18.1 %.  The global longitudinal strain is normal.   5. Right ventricular systolic function is normal. The right ventricular  size is normal. There is normal pulmonary artery systolic pressure.   6. The mitral valve is normal in structure. Trivial mitral valve  regurgitation.   7. No evidence of mitral valve prolapse at rest or with valsalva on  M-mode imaging.   8. The aortic valve is tricuspid. There is mild thickening of the aortic  valve. Aortic valve regurgitation is not visualized.   9.  The inferior vena cava is normal in size with greater than 50%  respiratory variability, suggesting right atrial pressure of 3 mmHg.   Myoview 03/01/2020  The left ventricular ejection fraction is hyperdynamic (>65%). Nuclear stress EF: 70%. There was no ST segment deviation noted during stress. Normal myocardial perfusion at rest and with stress. The study is normal. This is a low risk study.   Gwyndolyn Kaufman, MD  EKG:  EKG is not ordered today.    Recent Labs: 05/09/2020: Magnesium 2.1 06/28/2020: ALT 18; BUN 15; Creatinine, Ser 0.81; Hemoglobin 14.3; Platelets 276; Potassium 4.3; Sodium 141  Recent Lipid Panel    Component Value Date/Time   CHOL 136 05/09/2020 1046   TRIG 162 (H) 05/09/2020 1046   HDL 41 05/09/2020 1046   CHOLHDL 3.3 05/09/2020 1046   CHOLHDL 3.7 07/02/2008 0230   VLDL 23 07/02/2008 0230   LDLCALC 67 05/09/2020 1046    Home Medications   Current Meds  Medication Sig   Acetaminophen-DM (DELSYM CHILD COUGH+SORE THROAT PO) Take 5 mLs by mouth as needed (COUGH).   atorvastatin (LIPITOR) 80 MG tablet Take 1 tablet (80 mg total) by mouth at bedtime.   cetirizine (ZYRTEC) 10 MG tablet    Cholecalciferol (VITAMIN D3) 25 MCG (1000 UT) CAPS Take 1,000 Units by mouth daily.   Coenzyme Q10 (COQ10) 100 MG CAPS Take 300 mg by mouth daily.   furosemide (LASIX) 20 MG tablet Take 1 tablet (20 mg total) by mouth as needed (swelling).   hydrochlorothiazide (HYDRODIURIL) 12.5 MG tablet Take 12.5 mg by mouth daily.   ibuprofen (ADVIL) 200 MG tablet Take 200 mg by mouth every 6 (six) hours as needed for mild pain (HIP BURSITIS).   loratadine (CLARITIN) 10 MG tablet Take 10 mg by mouth daily as needed for allergies.   LORazepam (ATIVAN) 0.5 MG tablet Take 1 tablet by mouth as needed (ANXIETY).   losartan (COZAAR) 100 MG tablet Take 100 mg by mouth daily.   melatonin 5 MG TABS Take 5 mg by mouth at bedtime.   Multiple  Vitamins-Minerals (CENTRUM SILVER PO) Take 1 tablet by  mouth daily. 1 tab po qd   omeprazole (PRILOSEC) 20 MG capsule Take 20 mg by mouth as needed (INDIGESTION).   Oxymetazoline HCl (SUDAFED OM SINUS CONGESTION NA) Place 1 tablet into the nose as needed (SINUS PRESSURE / HEAD ACHE).   Pseudoephedrine HCl 30 MG CAPA Take 30 mg by mouth as needed (SINUS CONGESTION / HEAD ACHE).   pyridOXINE (VITAMIN B-6) 100 MG tablet Take 100 mg by mouth daily.     Review of Systems      All other systems reviewed and are otherwise negative except as noted above.  Physical Exam    VS:  BP 128/72    Pulse 68    Ht 5\' 7"  (1.702 m)    Wt 163 lb 9.6 oz (74.2 kg)    SpO2 98%    BMI 25.62 kg/m  , BMI Body mass index is 25.62 kg/m.  Wt Readings from Last 3 Encounters:  02/23/21 163 lb 9.6 oz (74.2 kg)  06/28/20 160 lb 15 oz (73 kg)  05/09/20 161 lb 3.2 oz (73.1 kg)     GEN: Well nourished, well developed, in no acute distress. HEENT: normal. Neck: Supple, no JVD, carotid bruits, or masses. Cardiac: sinus bradycardia, no murmurs, rubs, or gallops. No clubbing, cyanosis, edema.  Radials/PT 2+ and equal bilaterally.  Respiratory:  Respirations regular and unlabored, clear to auscultation bilaterally. GI: Soft, nontender, nondistended. MS: No deformity or atrophy. Skin: Warm and dry, no rash. Neuro:  Strength and sensation are intact. Psych: Normal affect.  Assessment & Plan    Atypical chest pain -Episodes back in March 2022 were suggestive of atypical chest pain which came on when she was stressed but not with activity.  On Lexiscan Myoview last year she did not show any signs of ischemia.  No further work-up was recommended at that time -When she presented in April 2022 with burning chest pain was thought to be more GI in etiology however the patient does not think the pain was the same as her esophagitis pain. Her EKG was negative and troponin was negative.  -Episodes are infrequent and last a short amount of time, usually stress related -She is able to  walk 3 miles, 4 days a week without any pain or symptoms -If her pain becomes more frequent or more severe I have a low threshold to order a cardiac CT scan due to her extensive family history  Bradycardia -Metoprolol discontinued last year -Continue to monitor heart rate closely -Lowest heart rate on her monitor was 36 bpm at nighttime. -She is asymptomatic during her bradycardia  3. SVT -16 episodes of SVT on her ZIO monitor, only 1 episode of SVT where she pushed her button indicating symptoms -With her baseline bradycardia I do not think she would be a good candidate for a beta-blocker -Encouraged limiting caffeine intake and decreasing stress -We will order a TSH and a magnesium level today -Recent electrolyte panel and CBC were all normal  4. Mitral valve prolapse -Last echocardiogram 03/01/2020 showed no evidence of mitral valve prolapse -No murmur on exam  5. Leg pain -ABIs last year were normal -Leg pain has subsided  6. Hypertension -On HCTZ 12.5 mg daily,  losartan 100 mg daily. -Continue low-sodium diet -Continue to monitor your blood pressure at home -Well-controlled in the clinic today  7. Hyperlipidemia -Recent lipid panel done earlier this year with values in range -Continue atorvastatin 80 mg daily  Disposition: Follow  up 3 months with Donato Heinz, MD or APP.  Signed, Elgie Collard, PA-C 02/23/2021, 10:29 AM [place]

## 2021-02-23 ENCOUNTER — Other Ambulatory Visit: Payer: Self-pay

## 2021-02-23 ENCOUNTER — Encounter: Payer: Self-pay | Admitting: Physician Assistant

## 2021-02-23 ENCOUNTER — Ambulatory Visit: Payer: PPO | Admitting: Physician Assistant

## 2021-02-23 VITALS — BP 128/72 | HR 68 | Ht 67.0 in | Wt 163.6 lb

## 2021-02-23 DIAGNOSIS — R001 Bradycardia, unspecified: Secondary | ICD-10-CM

## 2021-02-23 DIAGNOSIS — M79604 Pain in right leg: Secondary | ICD-10-CM

## 2021-02-23 DIAGNOSIS — I10 Essential (primary) hypertension: Secondary | ICD-10-CM

## 2021-02-23 DIAGNOSIS — M79605 Pain in left leg: Secondary | ICD-10-CM | POA: Diagnosis not present

## 2021-02-23 DIAGNOSIS — I471 Supraventricular tachycardia: Secondary | ICD-10-CM

## 2021-02-23 DIAGNOSIS — I341 Nonrheumatic mitral (valve) prolapse: Secondary | ICD-10-CM | POA: Diagnosis not present

## 2021-02-23 DIAGNOSIS — R0789 Other chest pain: Secondary | ICD-10-CM | POA: Diagnosis not present

## 2021-02-23 DIAGNOSIS — E782 Mixed hyperlipidemia: Secondary | ICD-10-CM | POA: Diagnosis not present

## 2021-02-23 LAB — MAGNESIUM: Magnesium: 2.3 mg/dL (ref 1.6–2.3)

## 2021-02-23 LAB — TSH: TSH: 1.71 u[IU]/mL (ref 0.450–4.500)

## 2021-02-23 MED ORDER — FUROSEMIDE 20 MG PO TABS: 20.0000 mg | ORAL_TABLET | ORAL | 1 refills | Status: DC | PRN

## 2021-02-23 NOTE — Patient Instructions (Addendum)
Medication Instructions:  Your physician has recommended you make the following change in your medication:   START Lasix 20 mg taking only as needed for swelling     *If you need a refill on your cardiac medications before your next appointment, please call your pharmacy*   Lab Work: TODAY:  TSH & MAG  If you have labs (blood work) drawn today and your tests are completely normal, you will receive your results only by: [place] (if you have MyChart) OR A paper copy in the mail If you have any lab test that is abnormal or we need to change your treatment, we will call you to review the results.   Testing/Procedures: None ordered  Follow-Up: At [facility], you and your health needs are our priority.  As part of our continuing mission to provide you with exceptional heart care, we have created designated Provider Care Teams.  These Care Teams include your primary Cardiologist (physician) and Advanced Practice Providers (APPs -  Physician Assistants and Nurse Practitioners) who all work together to provide you with the care you need, when you need it.  We recommend signing up for the patient portal called "MyChart".  Sign up information is provided on this After Visit Summary.  MyChart is used to connect with patients for Virtual Visits (Telemedicine).  Patients are able to view lab/test results, encounter notes, upcoming appointments, etc.  Non-urgent messages can be sent to your provider as well.   To learn more about what you can do with MyChart, go to [email or web address].    Your next appointment:   07/02/2021 ARRIVE AT 8:45  The format for your next appointment:   In Person  Provider:   Donato Heinz, MD     Other Instructions

## 2021-03-14 DIAGNOSIS — M25511 Pain in right shoulder: Secondary | ICD-10-CM | POA: Diagnosis not present

## 2021-03-22 DIAGNOSIS — M25511 Pain in right shoulder: Secondary | ICD-10-CM | POA: Diagnosis not present

## 2021-03-22 DIAGNOSIS — M5412 Radiculopathy, cervical region: Secondary | ICD-10-CM | POA: Diagnosis not present

## 2021-05-01 DIAGNOSIS — M542 Cervicalgia: Secondary | ICD-10-CM | POA: Diagnosis not present

## 2021-05-01 DIAGNOSIS — M5412 Radiculopathy, cervical region: Secondary | ICD-10-CM | POA: Diagnosis not present

## 2021-05-01 DIAGNOSIS — I10 Essential (primary) hypertension: Secondary | ICD-10-CM | POA: Diagnosis not present

## 2021-05-01 DIAGNOSIS — K148 Other diseases of tongue: Secondary | ICD-10-CM | POA: Diagnosis not present

## 2021-05-03 DIAGNOSIS — M5412 Radiculopathy, cervical region: Secondary | ICD-10-CM | POA: Diagnosis not present

## 2021-05-03 DIAGNOSIS — M542 Cervicalgia: Secondary | ICD-10-CM | POA: Diagnosis not present

## 2021-05-18 DIAGNOSIS — Z6826 Body mass index (BMI) 26.0-26.9, adult: Secondary | ICD-10-CM | POA: Diagnosis not present

## 2021-05-18 DIAGNOSIS — I10 Essential (primary) hypertension: Secondary | ICD-10-CM | POA: Diagnosis not present

## 2021-05-18 DIAGNOSIS — M4712 Other spondylosis with myelopathy, cervical region: Secondary | ICD-10-CM | POA: Diagnosis not present

## 2021-05-29 DIAGNOSIS — M4802 Spinal stenosis, cervical region: Secondary | ICD-10-CM | POA: Diagnosis not present

## 2021-05-29 DIAGNOSIS — M542 Cervicalgia: Secondary | ICD-10-CM | POA: Diagnosis not present

## 2021-06-01 DIAGNOSIS — M542 Cervicalgia: Secondary | ICD-10-CM | POA: Diagnosis not present

## 2021-06-01 DIAGNOSIS — M4802 Spinal stenosis, cervical region: Secondary | ICD-10-CM | POA: Diagnosis not present

## 2021-06-04 DIAGNOSIS — M542 Cervicalgia: Secondary | ICD-10-CM | POA: Diagnosis not present

## 2021-06-04 DIAGNOSIS — M4802 Spinal stenosis, cervical region: Secondary | ICD-10-CM | POA: Diagnosis not present

## 2021-06-07 DIAGNOSIS — M4802 Spinal stenosis, cervical region: Secondary | ICD-10-CM | POA: Diagnosis not present

## 2021-06-07 DIAGNOSIS — M542 Cervicalgia: Secondary | ICD-10-CM | POA: Diagnosis not present

## 2021-06-11 DIAGNOSIS — M4802 Spinal stenosis, cervical region: Secondary | ICD-10-CM | POA: Diagnosis not present

## 2021-06-11 DIAGNOSIS — M542 Cervicalgia: Secondary | ICD-10-CM | POA: Diagnosis not present

## 2021-06-14 DIAGNOSIS — M4802 Spinal stenosis, cervical region: Secondary | ICD-10-CM | POA: Diagnosis not present

## 2021-06-14 DIAGNOSIS — M542 Cervicalgia: Secondary | ICD-10-CM | POA: Diagnosis not present

## 2021-06-18 DIAGNOSIS — M4802 Spinal stenosis, cervical region: Secondary | ICD-10-CM | POA: Diagnosis not present

## 2021-06-18 DIAGNOSIS — M542 Cervicalgia: Secondary | ICD-10-CM | POA: Diagnosis not present

## 2021-06-21 DIAGNOSIS — M542 Cervicalgia: Secondary | ICD-10-CM | POA: Diagnosis not present

## 2021-06-21 DIAGNOSIS — M4802 Spinal stenosis, cervical region: Secondary | ICD-10-CM | POA: Diagnosis not present

## 2021-06-26 DIAGNOSIS — M542 Cervicalgia: Secondary | ICD-10-CM | POA: Diagnosis not present

## 2021-06-26 DIAGNOSIS — M4802 Spinal stenosis, cervical region: Secondary | ICD-10-CM | POA: Diagnosis not present

## 2021-06-28 DIAGNOSIS — M4802 Spinal stenosis, cervical region: Secondary | ICD-10-CM | POA: Diagnosis not present

## 2021-06-28 DIAGNOSIS — M542 Cervicalgia: Secondary | ICD-10-CM | POA: Diagnosis not present

## 2021-07-01 NOTE — Progress Notes (Signed)
?Cardiology Office Note:   ? ?Date:  07/04/2021  ? ?ID:  [name], DOB Jun 12, 1945, MRN [id number] ? ?PCP:  Deland Pretty, MD  ?Cardiologist:  Donato Heinz, MD  ?Electrophysiologist:  None  ? ?Referring MD: Deland Pretty, MD  ? ?Chief Complaint  ?Patient presents with  ? Chest Pain  ? ? ?History of Present Illness:   ? ?[name] is a 76 y.o. female with a hx of mitral valve prolapse, hypertension, hyperlipidemia who presents for follow-up.  She was initially seen on 01/31/2020 for evaluation of chest pain.  She previously followed with Dr. Johnsie Cancel, last seen in 2012.  She had normal heart catheterization in 2010.  Stress echo in 2012 showed no evidence of ischemia, normal systolic function.  She reports she has been having chest pain that she describes as burning/tightness in center of her chest, that occurs when she is stressed.  She walks twice per week for 3.5 miles, denies any exertional chest pain.  Chest pain only occurs when she is under significant stress.  States that this past year was homeschooling her 29 year old granddaughter and would frequently have the chest pain at that time.  Reports episodes can last for up to 2 minutes.  Does report a history of esophageal spasm as well.  Also has been having rare palpitations.  Denies any lower extremity edema.  Reports occasional lightheadedness that has been attributed to vertigo, denies any syncope.  Does report she has been having burning/numbness in her legs.  No smoking history.  Family history includes mother died of MI at age 76, father had MI in 57s, and brother died of MI in 17s. ? ?Echocardiogram on 03/01/2020 shows normal biventricular function, no significant valvular disease.  ABIs on 03/01/2020 were normal.  Lexiscan Myoview on 03/02/2020 was normal.  Coronary calcium score of 71 (62nd percentile) on 03/01/2020. ? ?Since last clinic visit, she reports she has been doing okay.  Does report has been having episodes of chest pain.   Describes as tightness in chest, radiates to back and jaw.  Typically last about 15 minutes.  BP has been up to 170s over 90s while having chest pain.  She is walking 4 miles daily. ? ?Past Medical History:  ?Diagnosis Date  ? Anxiety state, unspecified   ? CHEST PAIN UNSPECIFIED   ? Essential hypertension, benign   ? MITRAL VALVE PROLAPSE   ? Mixed hyperlipidemia   ? ? ? ? ?Current Medications: ?Current Meds  ?Medication Sig  ? Acetaminophen-DM (DELSYM CHILD COUGH+SORE THROAT PO) Take 5 mLs by mouth as needed (COUGH).  ? atorvastatin (LIPITOR) 80 MG tablet Take 1 tablet (80 mg total) by mouth at bedtime.  ? cetirizine (ZYRTEC) 10 MG tablet   ? Cholecalciferol (VITAMIN D3) 25 MCG (1000 UT) CAPS Take 1,000 Units by mouth daily.  ? hydrochlorothiazide (HYDRODIURIL) 12.5 MG tablet Take 12.5 mg by mouth daily.  ? loratadine (CLARITIN) 10 MG tablet Take 10 mg by mouth daily as needed for allergies.  ? LORazepam (ATIVAN) 0.5 MG tablet Take 1 tablet by mouth as needed (ANXIETY).  ? losartan (COZAAR) 100 MG tablet Take 100 mg by mouth daily.  ? Multiple Vitamins-Minerals (CENTRUM SILVER PO) Take 1 tablet by mouth daily. 1 tab po qd  ? omeprazole (PRILOSEC) 20 MG capsule Take 20 mg by mouth as needed (INDIGESTION).  ? Pseudoephedrine HCl 30 MG CAPA Take 30 mg by mouth as needed (SINUS CONGESTION / HEAD ACHE).  ? pyridOXINE (VITAMIN B-6)  100 MG tablet Take 100 mg by mouth daily.  ?  ? ?Allergies:   Bactrim [sulfamethoxazole-trimethoprim], Codeine, Minocycline hcl, and Cefdinir  ? ?Social History  ? ?Socioeconomic History  ? Marital status: Married  ?  Spouse name: Not on file  ? Number of children: Not on file  ? Years of education: Not on file  ? Highest education level: Not on file  ?Occupational History  ? Not on file  ?Tobacco Use  ? Smoking status: Never  ? Smokeless tobacco: Never  ?Substance and Sexual Activity  ? Alcohol use: No  ? Drug use: No  ? Sexual activity: Yes  ?Other Topics Concern  ? Not on file  ?Social  History Narrative  ?   Lives in [place], [place] with her husband.   ?     She is employed with a [employer] in [place].  She is a   ?     nonsmoker, nondrinker and has moderate to heavy caffeine intake in the   ?     form of sodas.  (07/06/2008)  ? ?Social Determinants of Health  ? ?Financial Resource Strain: Not on file  ?Food Insecurity: Not on file  ?Transportation Needs: Not on file  ?Physical Activity: Not on file  ?Stress: Not on file  ?Social Connections: Not on file  ?  ? ?Family History: ?The patient's family history includes Coronary artery disease in an other family member. ? ?ROS:   ?Please see the history of present illness.    ? All other systems reviewed and are negative. ? ?EKGs/Labs/Other Studies Reviewed:   ? ?The following studies were reviewed today: ? ? ?EKG:   ?07/02/21: Sinus bradycardia, rate 56, no ST abnormality ? ? ?Recent Labs: ?02/23/2021: TSH 1.710 ?07/02/2021: BUN 11; Creatinine, Ser 0.69; Magnesium 2.2; Potassium 3.9; Sodium 146  ?Recent Lipid Panel ?   ?Component Value Date/Time  ? CHOL 145 07/02/2021 1021  ? TRIG 155 (H) 07/02/2021 1021  ? HDL 46 07/02/2021 1021  ? CHOLHDL 3.2 07/02/2021 1021  ? CHOLHDL 3.7 07/02/2008 0230  ? VLDL 23 07/02/2008 0230  ? LDLCALC 72 07/02/2021 1021  ? ? ?Physical Exam:   ? ?VS:  BP 130/68   Pulse (!) 56   Ht '5\' 7"'$  (1.702 m)   Wt 168 lb 3.2 oz (76.3 kg)   SpO2 99%   BMI 26.34 kg/m?    ? ?Wt Readings from Last 3 Encounters:  ?07/02/21 168 lb 3.2 oz (76.3 kg)  ?02/23/21 163 lb 9.6 oz (74.2 kg)  ?06/28/20 160 lb 15 oz (73 kg)  ?  ? ?GEN: Well nourished, well developed in no acute distress ?HEENT: Normal ?NECK: No JVD; No carotid bruits ?LYMPHATICS: No lymphadenopathy ?CARDIAC: RRR, 2/6 systolic murmur ?RESPIRATORY:  Clear to auscultation without rales, wheezing or rhonchi  ?ABDOMEN: Soft, non-tender, non-distended ?MUSCULOSKELETAL:  No edema; No deformity  ?SKIN: Warm and dry ?NEUROLOGIC:  Alert and oriented x 3 ?PSYCHIATRIC:  Normal  affect  ? ?ASSESSMENT:   ? ?1. Chest pain of uncertain etiology   ?2. Mixed hyperlipidemia   ?3. Essential hypertension   ?4. Bradycardia   ? ? ?PLAN:   ? ?Chest pain: Description concerning for angina, as describes substernal chest pressure occurring when stressed.  She had normal heart catheterization in 2010 and negative stress echo in 2012.  Echocardiogram on 03/01/2020 shows normal biventricular function, no significant valvular disease.  Lexiscan Myoview on 03/02/2020 was normal. ?-She reports worsening chest pain recently.  Will  check coronary CTA to evaluate for obstructive CAD.  Resting heart rate in 50s, no Lopressor needed prior to study ? ?Bradycardia: Reports resting heart rate in 40s to 50s and has felt lightheadedness.  Discontinued metoprolol.  She wore cardiac monitor x14 days 12/2020, showed no significant arrhythmias, occasional PACs (1.5% of beats) ? ?Mitral valve prolapse: Reported history.  Systolic murmur on exam.  Echocardiogram on 03/01/2020 showed no evidence of mitral prolapse. ? ?Leg pain: ABIs on 03/01/2020 were normal.   ? ?Hyperlipidemia: LDL 67 on 05/09/20  On atorvastatin 80 mg daily.  Coronary calcium score of 71 (62nd percentile) on 03/01/2020.  Check lipid panel ? ?Hypertension: On hydrochlorothiazide 12.5 mg daily, losartan 100 mg daily.  Appears controlled.  Check BMET ? ? ?RTC in 6 months ? ? ? ? ?Medication Adjustments/Labs and Tests Ordered: ?Current medicines are reviewed at length with the patient today.  Concerns regarding medicines are outlined above.  ?Orders Placed This Encounter  ?Procedures  ? CT CORONARY MORPH W/CTA COR W/SCORE W/CA W/CM &/OR WO/CM  ? Basic metabolic panel  ? Magnesium  ? Lipid panel  ? EKG 12-Lead  ? ?No orders of the defined types were placed in this encounter. ? ? ?Patient Instructions  ?Medication Instructions:  ?Your physician recommends that you continue on your current medications as directed. Please refer to the Current Medication list given to  you today. ? ?*If you need a refill on your cardiac medications before your next appointment, please call your pharmacy* ? ? ?Lab Work: ?BMET, Mag, Lipid today ? ?If you have labs (blood work) drawn today and y

## 2021-07-02 ENCOUNTER — Ambulatory Visit: Payer: PPO | Admitting: Cardiology

## 2021-07-02 ENCOUNTER — Encounter: Payer: Self-pay | Admitting: Cardiology

## 2021-07-02 VITALS — BP 130/68 | HR 56 | Ht 67.0 in | Wt 168.2 lb

## 2021-07-02 DIAGNOSIS — E782 Mixed hyperlipidemia: Secondary | ICD-10-CM | POA: Diagnosis not present

## 2021-07-02 DIAGNOSIS — R001 Bradycardia, unspecified: Secondary | ICD-10-CM | POA: Diagnosis not present

## 2021-07-02 DIAGNOSIS — I10 Essential (primary) hypertension: Secondary | ICD-10-CM

## 2021-07-02 DIAGNOSIS — R079 Chest pain, unspecified: Secondary | ICD-10-CM

## 2021-07-02 NOTE — Patient Instructions (Signed)
Medication Instructions:  ?Your physician recommends that you continue on your current medications as directed. Please refer to the Current Medication list given to you today. ? ?*If you need a refill on your cardiac medications before your next appointment, please call your pharmacy* ? ? ?Lab Work: ?BMET, Mag, Lipid today ? ?If you have labs (blood work) drawn today and your tests are completely normal, you will receive your results only by: ?MyChart Message (if you have MyChart) OR ?A paper copy in the mail ?If you have any lab test that is abnormal or we need to change your treatment, we will call you to review the results. ? ? ?Testing/Procedures: ?Coronary CTA-see instructions below ? ?Follow-Up: ?At [facility], you and your health needs are our priority.  As part of our continuing mission to provide you with exceptional heart care, we have created designated Provider Care Teams.  These Care Teams include your primary Cardiologist (physician) and Advanced Practice Providers (APPs -  Physician Assistants and Nurse Practitioners) who all work together to provide you with the care you need, when you need it. ? ?We recommend signing up for the patient portal called "MyChart".  Sign up information is provided on this After Visit Summary.  MyChart is used to connect with patients for Virtual Visits (Telemedicine).  Patients are able to view lab/test results, encounter notes, upcoming appointments, etc.  Non-urgent messages can be sent to your provider as well.   ?To learn more about what you can do with MyChart, go to [email or web address].   ? ?Your next appointment:   ?6 month(s) ? ?The format for your next appointment:   ?In Person ? ?Provider:   ?Donato Heinz, MD { ? ? ? ?Other Instructions ? ? ?Your cardiac CT will be scheduled at one of the below locations:  ? ?[facility] ?[address] ?[place], [place] [address] ?[phone] ? ?If scheduled at [facility], please  arrive at the [facility] and Children's Entrance (Entrance C2) of [facility] 30 minutes prior to test start time. ?You can use the FREE valet parking offered at entrance C (encouraged to control the heart rate for the test)  ?Proceed to the [facility] Radiology Department (first floor) to check-in and test prep. ? ?All radiology patients and guests should use entrance C2 at [facility], accessed from [facility], even though the hospital's physical address listed is [address]. ? ? ? ?Please follow these instructions carefully (unless otherwise directed): ? ?On the Night Before the Test: ?Be sure to Drink plenty of water. ?Do not consume any caffeinated/decaffeinated beverages or chocolate 12 hours prior to your test. ?Do not take any antihistamines 12 hours prior to your test. ?If the patient has contrast allergy: ? ?On the Day of the Test: ?Drink plenty of water until 1 hour prior to the test. ?Do not eat any food 4 hours prior to the test. ?You may take your regular medications prior to the test.  ?HOLD Hydrochlorothiazide morning of the test. ?FEMALES- please wear underwire-free bra if available, avoid dresses & tight clothing ?     ?After the Test: ?Drink plenty of water. ?After receiving IV contrast, you may experience a mild flushed feeling. This is normal. ?On occasion, you may experience a mild rash up to 24 hours after the test. This is not dangerous. If this occurs, you can take Benadryl 25 mg and increase your fluid intake. ?If you experience trouble breathing, this can  be serious. If it is severe call 911 IMMEDIATELY. If it is mild, please call our office. ?If you take any of these medications: Glipizide/Metformin, Avandament, Glucavance, please do not take 48 hours after completing test unless otherwise instructed. ? ?We will call to schedule your test 2-4 weeks out understanding that some insurance companies will need an authorization prior to the service being  performed.  ? ?For non-scheduling related questions, please contact the cardiac imaging nurse navigator should you have any questions/concerns: ?Marchia Bond, Cardiac Imaging Nurse Navigator ?Gordy Clement, Cardiac Imaging Nurse Navigator ?[facility] ?Direct Office Dial: [phone]  ? ?For scheduling needs, including cancellations and rescheduling, please call [place], [phone]. ? ? ?Important Information About Sugar ? ? ? ? ? ? ?

## 2021-07-03 DIAGNOSIS — M4802 Spinal stenosis, cervical region: Secondary | ICD-10-CM | POA: Diagnosis not present

## 2021-07-03 DIAGNOSIS — M542 Cervicalgia: Secondary | ICD-10-CM | POA: Diagnosis not present

## 2021-07-03 LAB — BASIC METABOLIC PANEL
BUN/Creatinine Ratio: 16 (ref 12–28)
BUN: 11 mg/dL (ref 8–27)
CO2: 21 mmol/L (ref 20–29)
Calcium: 9.1 mg/dL (ref 8.7–10.3)
Chloride: 106 mmol/L (ref 96–106)
Creatinine, Ser: 0.69 mg/dL (ref 0.57–1.00)
Glucose: 86 mg/dL (ref 70–99)
Potassium: 3.9 mmol/L (ref 3.5–5.2)
Sodium: 146 mmol/L — ABNORMAL HIGH (ref 134–144)
eGFR: 92 mL/min/{1.73_m2} (ref >59)

## 2021-07-03 LAB — LIPID PANEL
Chol/HDL Ratio: 3.2 ratio (ref 0.0–4.4)
Cholesterol, Total: 145 mg/dL (ref 100–199)
HDL: 46 mg/dL (ref >39)
LDL Chol Calc (NIH): 72 mg/dL (ref 0–99)
Triglycerides: 155 mg/dL — ABNORMAL HIGH (ref 0–149)
VLDL Cholesterol Cal: 27 mg/dL (ref 5–40)

## 2021-07-03 LAB — MAGNESIUM: Magnesium: 2.2 mg/dL (ref 1.6–2.3)

## 2021-07-05 DIAGNOSIS — M542 Cervicalgia: Secondary | ICD-10-CM | POA: Diagnosis not present

## 2021-07-05 DIAGNOSIS — M4802 Spinal stenosis, cervical region: Secondary | ICD-10-CM | POA: Diagnosis not present

## 2021-07-08 DIAGNOSIS — R7989 Other specified abnormal findings of blood chemistry: Secondary | ICD-10-CM | POA: Diagnosis not present

## 2021-07-08 DIAGNOSIS — E7800 Pure hypercholesterolemia, unspecified: Secondary | ICD-10-CM | POA: Diagnosis not present

## 2021-07-08 DIAGNOSIS — I10 Essential (primary) hypertension: Secondary | ICD-10-CM | POA: Diagnosis not present

## 2021-07-08 DIAGNOSIS — I700 Atherosclerosis of aorta: Secondary | ICD-10-CM | POA: Diagnosis not present

## 2021-07-10 DIAGNOSIS — M542 Cervicalgia: Secondary | ICD-10-CM | POA: Diagnosis not present

## 2021-07-10 DIAGNOSIS — M4802 Spinal stenosis, cervical region: Secondary | ICD-10-CM | POA: Diagnosis not present

## 2021-07-12 ENCOUNTER — Telehealth (HOSPITAL_COMMUNITY): Payer: Self-pay | Admitting: Emergency Medicine

## 2021-07-12 NOTE — Telephone Encounter (Signed)
Reaching out to patient to offer assistance regarding upcoming cardiac imaging study; pt verbalizes understanding of appt date/time, parking situation and where to check in, pre-test NPO status and medications ordered, and verified current allergies; name and call back number provided for further questions should they arise ?Marchia Bond RN Navigator Cardiac Imaging ?[facility] ?[phone] office ?[phone] cell ? ?Holding HCTZ ?Difficult IV ?Arrival 200 ?

## 2021-07-13 ENCOUNTER — Ambulatory Visit (HOSPITAL_COMMUNITY)
Admission: RE | Admit: 2021-07-13 | Discharge: 2021-07-13 | Disposition: A | Discharge status: Home / Self Care | Payer: PPO | Source: Ambulatory Visit | Attending: Cardiology | Admitting: Cardiology

## 2021-07-13 DIAGNOSIS — I2510 Atherosclerotic heart disease of native coronary artery without angina pectoris: Secondary | ICD-10-CM | POA: Diagnosis not present

## 2021-07-13 DIAGNOSIS — R079 Chest pain, unspecified: Secondary | ICD-10-CM

## 2021-07-13 DIAGNOSIS — K449 Diaphragmatic hernia without obstruction or gangrene: Secondary | ICD-10-CM | POA: Insufficient documentation

## 2021-07-13 MED ORDER — NITROGLYCERIN 0.4 MG SL SUBL
0.8000 mg | SUBLINGUAL_TABLET | Freq: Once | SUBLINGUAL | Status: AC
  Administered 2021-07-13: 0.8 mg via SUBLINGUAL

## 2021-07-13 MED ORDER — IOHEXOL 350 MG/ML SOLN
100.0000 mL | Freq: Once | INTRAVENOUS | Status: AC | PRN
  Administered 2021-07-13: 100 mL via INTRAVENOUS

## 2021-07-13 MED ORDER — NITROGLYCERIN 0.4 MG SL SUBL
SUBLINGUAL_TABLET | SUBLINGUAL | Status: AC
  Filled 2021-07-13: qty 2

## 2021-07-19 DIAGNOSIS — M542 Cervicalgia: Secondary | ICD-10-CM | POA: Diagnosis not present

## 2021-07-19 DIAGNOSIS — M4802 Spinal stenosis, cervical region: Secondary | ICD-10-CM | POA: Diagnosis not present

## 2021-07-25 DIAGNOSIS — M4802 Spinal stenosis, cervical region: Secondary | ICD-10-CM | POA: Diagnosis not present

## 2021-07-25 DIAGNOSIS — M542 Cervicalgia: Secondary | ICD-10-CM | POA: Diagnosis not present

## 2021-07-27 DIAGNOSIS — J019 Acute sinusitis, unspecified: Secondary | ICD-10-CM | POA: Diagnosis not present

## 2021-07-27 DIAGNOSIS — R0989 Other specified symptoms and signs involving the circulatory and respiratory systems: Secondary | ICD-10-CM | POA: Diagnosis not present

## 2021-08-02 DIAGNOSIS — M542 Cervicalgia: Secondary | ICD-10-CM | POA: Diagnosis not present

## 2021-08-02 DIAGNOSIS — M4802 Spinal stenosis, cervical region: Secondary | ICD-10-CM | POA: Diagnosis not present

## 2021-08-07 DIAGNOSIS — M542 Cervicalgia: Secondary | ICD-10-CM | POA: Diagnosis not present

## 2021-08-07 DIAGNOSIS — M4802 Spinal stenosis, cervical region: Secondary | ICD-10-CM | POA: Diagnosis not present

## 2021-08-31 DIAGNOSIS — M4712 Other spondylosis with myelopathy, cervical region: Secondary | ICD-10-CM | POA: Diagnosis not present

## 2021-08-31 DIAGNOSIS — M4722 Other spondylosis with radiculopathy, cervical region: Secondary | ICD-10-CM | POA: Diagnosis not present

## 2021-08-31 DIAGNOSIS — K146 Glossodynia: Secondary | ICD-10-CM | POA: Diagnosis not present

## 2021-09-07 ENCOUNTER — Encounter: Payer: Self-pay | Admitting: Neurology

## 2021-11-13 NOTE — Progress Notes (Signed)
Initial neurology clinic note  [name] MRN: [id number] DOB: 15-Nov-1945  Referring provider: Newman Pies, MD  Primary care provider: Deland Pretty, MD  Reason for consult:  tongue pain and spasms  Subjective:  This is [name], a 76 y.o. right-handed female with a medical history of HTN, HLD, GERD, cervical spondylosis with radiculopathy (s/p ACDF at C4-5 about 17 years ago), SVT, and anxiety who presents to neurology clinic with tongue pain. The patient is alone today.  Patient started having symptoms in her tongue in 10/2020. She had an episode of her tongue deviating out of her mouth to the right. She also had a burning sensation on the left side of her tongue that she could improve by touching her tongue. It would last < 5 seconds and stop after touching it. The burning sensation began happening more frequently with burning on both sides and the tip of the tongue in 01/2021. Per referring provider documentation, patient was seen by Dr. Arnoldo Morale at [facility] on 08/31/21 and mentioned ongoing tongue spasms and pain. She had a cervical spine MRI with C3 and C6 neural foraminal stenosis. She has tried gabapentin 300 mg TID without relief (started in 04/2021). The burning episodes increased. She thinks it was more on gabapentin, so she decreased the gabapentin. Her episodes decreased as she decreased gabapentin. She was also having difficulty with vision. She is currently taking gabapentin 100 mg daily (took last dose today). She has a detailed list of episodes since 01/2021 and has at least 100 episodes documented. The longest lasting episode was about 30 seconds.  She does not see any trigger that causes the episodes. They can happen when she is just laying in bed.  Patient felt like she might have a sore in her mouth (raw on the edge, but she didn't see anything). She did notice a lesion under her tongue this past week. She put ointment on it and  it is clearing up. There was no associated tongue symptoms with this. There is sometimes associated radiation to bilateral jaws and to the ear canal (~ 10 times). Overall, the symptoms are not disabling. She is able to keep doing what she is doing.  Per patient, she had noticed that her tongue had white crust. She was wondering about thrush. This has improved with changing tooth paste and mouth wash.  She denies eye watering, headaches, vision changes, or changes to taste.  She has not seen a dentist since her mouth symptoms began.  In 03/2021 patient had new symptoms. Patient noticed shoulder pain on right. OTC treatment did not help. She was seen by PA who gave Celebrex and Flexeril. She saw ortho who also agreed with shoulder pain. Lidocaine patch was also added. She noticed weakness in her right hand about 1 week later (seemed more like finger flexors). She still had pain. She was told by ortho that this was related to her cervical spine. She was sent to PT for 12 weeks. The pain resolved and her strength is almost all the way back. The shoulder issues kept her from seeking care about her tongue.  Patient also mentions acute low back pain that would make her buckle at the knees. This lasted about 1 week before resolving. She states it is a locking up sensation. It would mostly occur when turning to the right. Patient also mentions occasional balance problems which she attributes to "situational vertigo" when she will turn or going down and up.  She was previously taking B6 for about 6 months (started in 12/2020) and is currently taking centrum silver that she states has a lot of B6. She only took it for 1 month.  EtOH: none Restrictive diet: No Family history of neurologic problems: No  MEDICATIONS:  Outpatient Encounter Medications as of 11/16/2021  Medication Sig   Acetaminophen-DM (DELSYM CHILD COUGH+SORE THROAT PO) Take 5 mLs by mouth as needed (COUGH).   atorvastatin (LIPITOR) 80 MG  tablet Take 1 tablet (80 mg total) by mouth at bedtime.   cetirizine (ZYRTEC) 10 MG tablet    Cholecalciferol (VITAMIN D3) 25 MCG (1000 UT) CAPS Take 1,000 Units by mouth daily.   hydrochlorothiazide (HYDRODIURIL) 12.5 MG tablet Take 12.5 mg by mouth daily.   loratadine (CLARITIN) 10 MG tablet Take 10 mg by mouth daily as needed for allergies.   LORazepam (ATIVAN) 0.5 MG tablet Take 1 tablet by mouth as needed (ANXIETY).   losartan (COZAAR) 100 MG tablet Take 100 mg by mouth daily.   Multiple Vitamins-Minerals (CENTRUM SILVER PO) Take 1 tablet by mouth daily. 1 tab po qd   omeprazole (PRILOSEC) 20 MG capsule Take 20 mg by mouth as needed (INDIGESTION).   Pseudoephedrine HCl 30 MG CAPA Take 30 mg by mouth as needed (SINUS CONGESTION / HEAD ACHE).   [DISCONTINUED] furosemide (LASIX) 20 MG tablet Take 1 tablet (20 mg total) by mouth as needed (swelling).   [DISCONTINUED] pyridOXINE (VITAMIN B-6) 100 MG tablet Take 100 mg by mouth daily.   No facility-administered encounter medications on file as of 11/16/2021.    PAST MEDICAL HISTORY: Past Medical History:  Diagnosis Date   Anxiety state, unspecified    CHEST PAIN UNSPECIFIED    Essential hypertension, benign    MITRAL VALVE PROLAPSE    Mixed hyperlipidemia     PAST SURGICAL HISTORY: Past Surgical History:  Procedure Laterality Date   APPENDECTOMY     CARDIAC CATHETERIZATION     SPINE SURGERY     TUBAL LIGATION      ALLERGIES: Allergies  Allergen Reactions   Bactrim [Sulfamethoxazole-Trimethoprim]     INTOLERANCE NAUSEA   Codeine    Minocycline Hcl    Cefdinir Hives, Itching and Rash    FAMILY HISTORY: Family History  Problem Relation Age of Onset   Coronary artery disease Other     SOCIAL HISTORY: Social History   Tobacco Use   Smoking status: Never   Smokeless tobacco: Never  Substance Use Topics   Alcohol use: No   Drug use: No   Social History   Social History Narrative     Lives in [place], [place] with her husband.        She is employed with a [employer] in [place].  She is a        nonsmoker, nondrinker and has moderate to heavy caffeine intake in the        form of sodas.  (07/06/2008)    Objective:  Vital Signs:  BP (!) 155/82 (BP Location: Left Arm)   Pulse 70   Ht '5\' 7"'$  (1.702 m)   Wt 168 lb 12.8 oz (76.6 kg)   SpO2 95%   BMI 26.44 kg/m    General: No acute distress. Anxious.  Patient appears well-groomed.   Head:  Normocephalic/atraumatic Neck: supple, no paraspinal tenderness, full range of motion  Neurological Exam: Mental status: alert and oriented, speech fluent and not dysarthric, language intact.  Cranial nerves: CN I: not tested  CN II: pupils equal, round and reactive to light, visual fields intact CN III, IV, VI:  full range of motion, no nystagmus, no ptosis CN V: facial sensation intact. CN VII: upper and lower face symmetric CN VIII: hearing intact CN IX, X: uvula midline CN XI: sternocleidomastoid and trapezius muscles intact CN XII: tongue midline. No lesions appreciated on tongue, gums, or lips  Bulk & Tone: normal, no fasciculations. Motor:  muscle strength 5/5 throughout Deep Tendon Reflexes:  2+ throughout,  toes downgoing.   Sensation:  Intact to light touch Finger to nose testing:  Without dysmetria.   Gait:  Normal station and stride.  Imbalance when turning. Romberg negative.   Labs and Imaging review: Out-side paper records, electronic medical record, and images have been reviewed where available and summarized as:  Normal or unremarkable: BMP, Mg  Lab Results  Component Value Date   HGBA1C  07/02/2008    5.5 (NOTE) The ADA recommends the following therapeutic goal for glycemic control related to Hgb A1c measurement: Goal of therapy: <6.5 Hgb A1c  Reference: American Diabetes Association: Clinical Practice Recommendations 2010, Diabetes Care, 2010, 33: (Suppl  1).   No results found for: "VITAMINB12" Lab  Results  Component Value Date   TSH 1.710 02/23/2021   Cervical spine xrays (05/01/10): Findings: The patient is status post ACDF at C4-5.  The fusion  appears mature.  Progressive end plate changes are present adjacent  the fusion at C3-4 and C5-6.  Degenerative endplate changes present  at C6-7.  Osseous foraminal narrowing is present on the left at C5-  6.  Uncovertebral disease is asymmetric to the left at that level.   IMPRESSION:   1.  Asymmetric left-sided uncovertebral disease and osseous  foraminal narrowing at C5-6.  2.  Status post anterior fusion at C4-5.  3.  Progressive spondylosis  is present at C4-5, C5-6, and C6-7.   Assessment/Plan:  [name] is a 76 y.o. female who presents for evaluation of tongue burning and spasm and low back pain. She has a relevant medical history of HTN, HLD, GERD, cervical spondylosis with radiculopathy (s/p ACDF at C4-5 about 17 years ago), SVT, and anxiety. Her neurological examination is essentially normal today.   Patient had many symptoms that she was worried about in addition to the tongue burning including shoulder pain with weakness in right arm (since resolved) and occasional low back pain/spasms. I do not think any of these symptoms are related to one another. Her normal neurologic exam makes a brain or spine pathology as the etiology of her symptoms very unlikely. Her tongue is strong, making a lesion of the glossopharyngeal nerve unlikely. Symptoms do not match a trigeminal distribution nor do they sound consistent with trigeminal neuralgia. Pain secondary to herpes lesion (cold sore) is possible, though she has not seen lesions during most episodes and she can suppress the pain by touching her tongue. Other possibilities include burning mouth syndrome or persistent idiopathic facial pain. A dental cause is also possible, but given that she does not have tooth or gum pain, this may be less likely. Patient has previously not tolerated  gabapentin, so I will try amitriptyline. I will start at a very low dose and attempt to titrate up to symptom relief given patient's concerns for side effects.  PLAN: -Blood work: B6, B12 -Start amitriptyline 10 mg qhs for 1 week, if no side effects, go to 20 mg daily  -Return to clinic in 5 months or sooner if  needed  The impression above as well as the plan as outlined below were extensively discussed with the patient who voiced understanding. All questions were answered to their satisfaction.  When available, results of the above investigations and possible further recommendations will be communicated to the patient via telephone/MyChart. Patient to call office if not contacted after expected testing turnaround time.   Total time spent reviewing records, interview, history/exam, documentation, and coordination of care on day of encounter:  75 min   Thank you for allowing me to participate in patient's care.  If I can answer any additional questions, I would be pleased to do so.  Kai Levins, MD   CC: Deland Pretty, MD [address] [place] [address]  CC: Referring provider: Newman Pies, MD [place] [address] [place] [address] [place],   [address]

## 2021-11-16 ENCOUNTER — Ambulatory Visit: Payer: PPO | Admitting: Neurology

## 2021-11-16 ENCOUNTER — Encounter: Payer: Self-pay | Admitting: Neurology

## 2021-11-16 ENCOUNTER — Other Ambulatory Visit (INDEPENDENT_AMBULATORY_CARE_PROVIDER_SITE_OTHER): Payer: PPO

## 2021-11-16 VITALS — BP 155/82 | HR 70 | Ht 67.0 in | Wt 168.8 lb

## 2021-11-16 DIAGNOSIS — K146 Glossodynia: Secondary | ICD-10-CM

## 2021-11-16 DIAGNOSIS — K148 Other diseases of tongue: Secondary | ICD-10-CM

## 2021-11-16 DIAGNOSIS — R519 Headache, unspecified: Secondary | ICD-10-CM | POA: Diagnosis not present

## 2021-11-16 DIAGNOSIS — M545 Low back pain, unspecified: Secondary | ICD-10-CM

## 2021-11-16 LAB — VITAMIN B12: Vitamin B-12: 688 pg/mL (ref 211–911)

## 2021-11-16 MED ORDER — AMITRIPTYLINE HCL 10 MG PO TABS: ORAL_TABLET | ORAL | 0 refills | Status: DC

## 2021-11-16 NOTE — Patient Instructions (Addendum)
I saw you today for tongue burning and spasms. We also discussed the right shoulder pain and low back pain.  Overall, your neurologic exam is normal without any concerning features. I do not think your shoulder, back, and tongue are connected.  Your symptoms may be due to a condition called burning mouth syndrome or idiopathic facial pain. Gabapentin can be used for this, but you did not do well on this previously.  Another medication we can try is amitriptyline. I will start you on a very low dose, 10 mg at night. It can make you sleepy, so take it at night. If you do not have significant side effects, increase it to 2 tablets (20 mg) at night after 1 week. This is still a low dose and may take time to build up in your system. Be patient with the medication. I have sent it to your pharmacy as requested.  I will also check B6 and B12 today to make sure there is not another cause of your symptoms.  I want to hear from you on how things are going after a month or so. I want to hear from you sooner if you are having new or worsening symptoms.  I would like to see you in clinic again in 5 months.  The physicians and staff at [facility] are committed to providing excellent care. You may receive a survey requesting feedback about your experience at our office. We strive to receive "very good" responses to the survey questions. If you feel that your experience would prevent you from giving the office a "very good " response, please contact our office to try to remedy the situation. We may be reached at [phone]. Thank you for taking the time out of your busy day to complete the survey.  Kai Levins, MD [facility]

## 2021-11-20 ENCOUNTER — Encounter: Payer: Self-pay | Admitting: Neurology

## 2021-11-20 LAB — VITAMIN B6: Vitamin B6: 47.3 ng/mL — ABNORMAL HIGH (ref 2.1–21.7)

## 2021-11-28 ENCOUNTER — Ambulatory Visit: Payer: PPO | Admitting: Neurology

## 2021-12-31 ENCOUNTER — Ambulatory Visit: Payer: PPO | Attending: Cardiology | Admitting: Cardiology

## 2021-12-31 ENCOUNTER — Encounter: Payer: Self-pay | Admitting: Cardiology

## 2021-12-31 VITALS — BP 126/68 | HR 74 | Ht 67.0 in | Wt 172.0 lb

## 2021-12-31 DIAGNOSIS — I10 Essential (primary) hypertension: Secondary | ICD-10-CM | POA: Diagnosis not present

## 2021-12-31 DIAGNOSIS — E782 Mixed hyperlipidemia: Secondary | ICD-10-CM | POA: Diagnosis not present

## 2021-12-31 DIAGNOSIS — R001 Bradycardia, unspecified: Secondary | ICD-10-CM | POA: Diagnosis not present

## 2021-12-31 DIAGNOSIS — I2510 Atherosclerotic heart disease of native coronary artery without angina pectoris: Secondary | ICD-10-CM | POA: Diagnosis not present

## 2021-12-31 DIAGNOSIS — R079 Chest pain, unspecified: Secondary | ICD-10-CM | POA: Diagnosis not present

## 2021-12-31 NOTE — Patient Instructions (Signed)
Medication Instructions:  Your physician recommends that you continue on your current medications as directed. Please refer to the Current Medication list given to you today.  *If you need a refill on your cardiac medications before your next appointment, please call your pharmacy*   Lab Work: Please have your primary doctor fax lab results to [place] after completed at [phone].  Follow-Up: At [facility], you and your health needs are our priority.  As part of our continuing mission to provide you with exceptional heart care, we have created designated Provider Care Teams.  These Care Teams include your primary Cardiologist (physician) and Advanced Practice Providers (APPs -  Physician Assistants and Nurse Practitioners) who all work together to provide you with the care you need, when you need it.  We recommend signing up for the patient portal called "MyChart".  Sign up information is provided on this After Visit Summary.  MyChart is used to connect with patients for Virtual Visits (Telemedicine).  Patients are able to view lab/test results, encounter notes, upcoming appointments, etc.  Non-urgent messages can be sent to your provider as well.   To learn more about what you can do with MyChart, go to [email or web address].    Your next appointment:   12 month(s)  The format for your next appointment:   In Person  Provider:   Donato Heinz, MD

## 2021-12-31 NOTE — Progress Notes (Signed)
Cardiology Office Note:    Date:  12/31/2021   ID:  [name], DOB 1946/02/10, MRN [id number]  PCP:  Deland Pretty, MD  Cardiologist:  Donato Heinz, MD  Electrophysiologist:  None   Referring MD: Deland Pretty, MD   Chief Complaint  Patient presents with   Follow-up    History of Present Illness:    [name] is a 76 y.o. female with a hx of mitral valve prolapse, hypertension, hyperlipidemia who presents for follow-up.  She was initially seen on 01/31/2020 for evaluation of chest pain.  She previously followed with Dr. Johnsie Cancel, last seen in 2012.  She had normal heart catheterization in 2010.  Stress echo in 2012 showed no evidence of ischemia, normal systolic function.  She reports she has been having chest pain that she describes as burning/tightness in center of her chest, that occurs when she is stressed.  She walks twice per week for 3.5 miles, denies any exertional chest pain.  Chest pain only occurs when she is under significant stress.  States that this past year was homeschooling her 14 year old granddaughter and would frequently have the chest pain at that time.  Reports episodes can last for up to 2 minutes.  Does report a history of esophageal spasm as well.  Also has been having rare palpitations.  Denies any lower extremity edema.  Reports occasional lightheadedness that has been attributed to vertigo, denies any syncope.  Does report she has been having burning/numbness in her legs.  No smoking history.  Family history includes mother died of MI at age 1, father had MI in 19s, and brother died of MI in 75s.  Echocardiogram on 03/01/2020 shows normal biventricular function, no significant valvular disease.  ABIs on 03/01/2020 were normal.  Lexiscan Myoview on 03/02/2020 was normal.  Coronary calcium score of 71 (62nd percentile) on 03/01/2020.  Subsequently reported she was having chest pain and underwent coronary CTA on 07/13/2021 which showed nonobstructive CAD  (calcified plaque in ostial/proximal LAD causing 0 to 24% stenosis), calcium score 98 (65th percentile).  Also noted to have small hiatal hernia.  Since last clinic visit, she reports she is doing well.  Reports chest pain has improved, however has rare chest pain the last for seconds and resolves.  Occasional palpitations that feels like skips a beat.  She was walking 3 to 4 miles 3 times per week, denies any exertional chest pain or dyspnea.  Reports recently has not been walking due to foot pain.  Past Medical History:  Diagnosis Date   Anxiety state, unspecified    CHEST PAIN UNSPECIFIED    Essential hypertension, benign    MITRAL VALVE PROLAPSE    Mixed hyperlipidemia       Current Medications: Current Meds  Medication Sig   Acetaminophen-DM (DELSYM CHILD COUGH+SORE THROAT PO) Take 5 mLs by mouth as needed (COUGH).   amitriptyline (ELAVIL) 10 MG tablet Take 1 tablet (10 mg total) by mouth at bedtime for 7 days, THEN 2 tablets (20 mg total) at bedtime.   atorvastatin (LIPITOR) 80 MG tablet Take 1 tablet (80 mg total) by mouth at bedtime.   cetirizine (ZYRTEC) 10 MG tablet    Cholecalciferol (VITAMIN D3) 25 MCG (1000 UT) CAPS Take 1,000 Units by mouth daily.   hydrochlorothiazide (HYDRODIURIL) 12.5 MG tablet Take 12.5 mg by mouth daily.   loratadine (CLARITIN) 10 MG tablet Take 10 mg by mouth daily as needed for allergies.   LORazepam (ATIVAN) 0.5 MG tablet Take  1 tablet by mouth as needed (ANXIETY).   losartan (COZAAR) 100 MG tablet Take 100 mg by mouth daily.   omeprazole (PRILOSEC) 20 MG capsule Take 20 mg by mouth as needed (INDIGESTION).   Pseudoephedrine HCl 30 MG CAPA Take 30 mg by mouth as needed (SINUS CONGESTION / HEAD ACHE).     Allergies:   Bactrim [sulfamethoxazole-trimethoprim], Codeine, Minocycline hcl, and Cefdinir   Social History   Socioeconomic History   Marital status: Married    Spouse name: Not on file   Number of children: Not on file   Years of  education: Not on file   Highest education level: Not on file  Occupational History   Not on file  Tobacco Use   Smoking status: Never   Smokeless tobacco: Never  Substance and Sexual Activity   Alcohol use: No   Drug use: No   Sexual activity: Yes  Other Topics Concern   Not on file  Social History Narrative     Lives in [place], [place] with her husband.        She is employed with a [employer] in [place].  She is a        nonsmoker, nondrinker and has moderate to heavy caffeine intake in the        form of sodas.  (07/06/2008)   Social Determinants of Health   Financial Resource Strain: Not on file  Food Insecurity: Not on file  Transportation Needs: Not on file  Physical Activity: Not on file  Stress: Not on file  Social Connections: Not on file     Family History: The patient's family history includes Coronary artery disease in an other family member.  ROS:   Please see the history of present illness.     All other systems reviewed and are negative.  EKGs/Labs/Other Studies Reviewed:    The following studies were reviewed today:   EKG:   07/02/21: Sinus bradycardia, rate 56, no ST abnormality 12/31/21: NSR, rate 77, no STabnormality  Recent Labs: 02/23/2021: TSH 1.710 07/02/2021: BUN 11; Creatinine, Ser 0.69; Magnesium 2.2; Potassium 3.9; Sodium 146  Recent Lipid Panel    Component Value Date/Time   CHOL 145 07/02/2021 1021   TRIG 155 (H) 07/02/2021 1021   HDL 46 07/02/2021 1021   CHOLHDL 3.2 07/02/2021 1021   CHOLHDL 3.7 07/02/2008 0230   VLDL 23 07/02/2008 0230   LDLCALC 72 07/02/2021 1021    Physical Exam:    VS:  BP 126/68 (BP Location: Left Arm, Patient Position: Sitting, Cuff Size: Normal)   Pulse 74   Ht '5\' 7"'$  (1.702 m)   Wt 172 lb (78 kg)   SpO2 99%   BMI 26.94 kg/m     Wt Readings from Last 3 Encounters:  12/31/21 172 lb (78 kg)  11/16/21 168 lb 12.8 oz (76.6 kg)  07/02/21 168 lb 3.2 oz (76.3 kg)     GEN: Well  nourished, well developed in no acute distress HEENT: Normal NECK: No JVD; No carotid bruits LYMPHATICS: No lymphadenopathy CARDIAC: RRR, 2/6 systolic murmur RESPIRATORY:  Clear to auscultation without rales, wheezing or rhonchi  ABDOMEN: Soft, non-tender, non-distended MUSCULOSKELETAL:  No edema; No deformity  SKIN: Warm and dry NEUROLOGIC:  Alert and oriented x 3 PSYCHIATRIC:  Normal affect   ASSESSMENT:    1. Chest pain of uncertain etiology   2. Bradycardia   3. Coronary artery disease involving native coronary artery of native heart without angina pectoris  4. Essential hypertension   5. Mixed hyperlipidemia      PLAN:    Chest pain: Description concerning for angina, as describes substernal chest pressure occurring when stressed.  She had normal heart catheterization in 2010 and negative stress echo in 2012.  Echocardiogram on 03/01/2020 shows normal biventricular function, no significant valvular disease.  Lexiscan Myoview on 03/02/2020 was normal.  Underwent coronary CTA on 07/13/2021 which showed nonobstructive CAD (calcified plaque in ostial/proximal LAD causing 0 to 24% stenosis), calcium score 98 (65th percentile).  Also noted to have small hiatal hernia. -She reports chest pain has improved, no further work-up at this time  Bradycardia: Reports resting heart rate in 40s to 50s and has felt lightheadedness.  Discontinued metoprolol.  She wore cardiac monitor x14 days 12/2020, showed no significant arrhythmias, occasional PACs (1.5% of beats) -Appears resolved  Mitral valve prolapse: Reported history.  Systolic murmur on exam.  Echocardiogram on 03/01/2020 showed no evidence of mitral prolapse.  Leg pain: ABIs on 03/01/2020 were normal.    Hyperlipidemia: LDL 72 on 07/02/2021 on atorvastatin 80 mg daily.  Calcium score 98 (65th percentile) on 07/13/2021.  She is having labs checked with PCP tomorrow, we will follow-up results  Hypertension: On hydrochlorothiazide 12.5 mg  daily, losartan 100 mg daily.  Appears controlled.     RTC in 1 year     Medication Adjustments/Labs and Tests Ordered: Current medicines are reviewed at length with the patient today.  Concerns regarding medicines are outlined above.  Orders Placed This Encounter  Procedures   EKG 12-Lead   No orders of the defined types were placed in this encounter.   Patient Instructions  Medication Instructions:  Your physician recommends that you continue on your current medications as directed. Please refer to the Current Medication list given to you today.  *If you need a refill on your cardiac medications before your next appointment, please call your pharmacy*   Lab Work: Please have your primary doctor fax lab results to [place] after completed at [phone].  Follow-Up: At [facility], you and your health needs are our priority.  As part of our continuing mission to provide you with exceptional heart care, we have created designated Provider Care Teams.  These Care Teams include your primary Cardiologist (physician) and Advanced Practice Providers (APPs -  Physician Assistants and Nurse Practitioners) who all work together to provide you with the care you need, when you need it.  We recommend signing up for the patient portal called "MyChart".  Sign up information is provided on this After Visit Summary.  MyChart is used to connect with patients for Virtual Visits (Telemedicine).  Patients are able to view lab/test results, encounter notes, upcoming appointments, etc.  Non-urgent messages can be sent to your provider as well.   To learn more about what you can do with MyChart, go to [email or web address].    Your next appointment:   12 month(s)  The format for your next appointment:   In Person  Provider:   Donato Heinz, MD              Signed, Donato Heinz, MD  12/31/2021 8:34 AM    [place]

## 2022-01-01 DIAGNOSIS — R7303 Prediabetes: Secondary | ICD-10-CM | POA: Diagnosis not present

## 2022-01-01 DIAGNOSIS — I10 Essential (primary) hypertension: Secondary | ICD-10-CM | POA: Diagnosis not present

## 2022-01-01 DIAGNOSIS — E7800 Pure hypercholesterolemia, unspecified: Secondary | ICD-10-CM | POA: Diagnosis not present

## 2022-01-02 NOTE — Addendum Note (Signed)
Addended by: Ellwood Handler on: 43/[id number] 04:41 PM   Modules accepted: Orders

## 2022-01-04 DIAGNOSIS — Z0000 Encounter for general adult medical examination without abnormal findings: Secondary | ICD-10-CM | POA: Diagnosis not present

## 2022-01-04 DIAGNOSIS — G4700 Insomnia, unspecified: Secondary | ICD-10-CM | POA: Diagnosis not present

## 2022-01-04 DIAGNOSIS — I10 Essential (primary) hypertension: Secondary | ICD-10-CM | POA: Diagnosis not present

## 2022-01-04 DIAGNOSIS — I700 Atherosclerosis of aorta: Secondary | ICD-10-CM | POA: Diagnosis not present

## 2022-01-04 DIAGNOSIS — K824 Cholesterolosis of gallbladder: Secondary | ICD-10-CM | POA: Diagnosis not present

## 2022-01-04 DIAGNOSIS — M79672 Pain in left foot: Secondary | ICD-10-CM | POA: Diagnosis not present

## 2022-01-04 DIAGNOSIS — K148 Other diseases of tongue: Secondary | ICD-10-CM | POA: Diagnosis not present

## 2022-01-04 DIAGNOSIS — K200 Eosinophilic esophagitis: Secondary | ICD-10-CM | POA: Diagnosis not present

## 2022-01-04 DIAGNOSIS — Z23 Encounter for immunization: Secondary | ICD-10-CM | POA: Diagnosis not present

## 2022-01-04 DIAGNOSIS — I2510 Atherosclerotic heart disease of native coronary artery without angina pectoris: Secondary | ICD-10-CM | POA: Diagnosis not present

## 2022-01-16 DIAGNOSIS — E663 Overweight: Secondary | ICD-10-CM | POA: Diagnosis not present

## 2022-01-16 DIAGNOSIS — M25572 Pain in left ankle and joints of left foot: Secondary | ICD-10-CM | POA: Diagnosis not present

## 2022-01-16 DIAGNOSIS — E7800 Pure hypercholesterolemia, unspecified: Secondary | ICD-10-CM | POA: Diagnosis not present

## 2022-01-16 DIAGNOSIS — R7303 Prediabetes: Secondary | ICD-10-CM | POA: Diagnosis not present

## 2022-01-16 DIAGNOSIS — M79672 Pain in left foot: Secondary | ICD-10-CM | POA: Diagnosis not present

## 2022-01-16 DIAGNOSIS — M13872 Other specified arthritis, left ankle and foot: Secondary | ICD-10-CM | POA: Diagnosis not present

## 2022-01-30 DIAGNOSIS — J209 Acute bronchitis, unspecified: Secondary | ICD-10-CM | POA: Diagnosis not present

## 2022-01-30 DIAGNOSIS — R051 Acute cough: Secondary | ICD-10-CM | POA: Diagnosis not present

## 2022-01-30 DIAGNOSIS — J069 Acute upper respiratory infection, unspecified: Secondary | ICD-10-CM | POA: Diagnosis not present

## 2022-02-14 ENCOUNTER — Other Ambulatory Visit: Payer: Self-pay | Admitting: Neurology

## 2022-02-14 DIAGNOSIS — K146 Glossodynia: Secondary | ICD-10-CM

## 2022-02-14 DIAGNOSIS — R519 Headache, unspecified: Secondary | ICD-10-CM

## 2022-03-01 DIAGNOSIS — M4722 Other spondylosis with radiculopathy, cervical region: Secondary | ICD-10-CM | POA: Diagnosis not present

## 2022-03-06 DIAGNOSIS — M13872 Other specified arthritis, left ankle and foot: Secondary | ICD-10-CM | POA: Diagnosis not present

## 2022-04-12 NOTE — Progress Notes (Signed)
NEUROLOGY FOLLOW UP OFFICE NOTE  [name] LZ:[id number]  Subjective:  [name] is a 77 y.o. year old female with a medical history of HTN, HLD, GERD, cervical spondylosis with radiculopathy (s/p ACDF at C4-5 about 17 years ago), SVT, and anxiety who we last saw on 11/16/21.  To briefly review: Patient started having symptoms in her tongue in 10/2020. She had an episode of her tongue deviating out of her mouth to the right. She also had a burning sensation on the left side of her tongue that she could improve by touching her tongue. It would last < 5 seconds and stop after touching it. The burning sensation began happening more frequently with burning on both sides and the tip of the tongue in 01/2021. Per referring provider documentation, patient was seen by Dr. Arnoldo Morale at [facility] on 08/31/21 and mentioned ongoing tongue spasms and pain. She had a cervical spine MRI with C3 and C6 neural foraminal stenosis. She has tried gabapentin 300 mg TID without relief (started in 04/2021). The burning episodes increased. She thinks it was more on gabapentin, so she decreased the gabapentin. Her episodes decreased as she decreased gabapentin. She was also having difficulty with vision. She is currently taking gabapentin 100 mg daily (took last dose today). She has a detailed list of episodes since 01/2021 and has at least 100 episodes documented. The longest lasting episode was about 30 seconds.   She does not see any trigger that causes the episodes. They can happen when she is just laying in bed.   Patient felt like she might have a sore in her mouth (raw on the edge, but she didn't see anything). She did notice a lesion under her tongue this past week. She put ointment on it and it is clearing up. There was no associated tongue symptoms with this. There is sometimes associated radiation to bilateral jaws and to the ear canal (~ 10 times). Overall, the symptoms are not  disabling. She is able to keep doing what she is doing.   Per patient, she had noticed that her tongue had white crust. She was wondering about thrush. This has improved with changing tooth paste and mouth wash.   She denies eye watering, headaches, vision changes, or changes to taste.   She has not seen a dentist since her mouth symptoms began.   In 03/2021 patient had new symptoms. Patient noticed shoulder pain on right. OTC treatment did not help. She was seen by PA who gave Celebrex and Flexeril. She saw ortho who also agreed with shoulder pain. Lidocaine patch was also added. She noticed weakness in her right hand about 1 week later (seemed more like finger flexors). She still had pain. She was told by ortho that this was related to her cervical spine. She was sent to PT for 12 weeks. The pain resolved and her strength is almost all the way back. The shoulder issues kept her from seeking care about her tongue.   Patient also mentions acute low back pain that would make her buckle at the knees. This lasted about 1 week before resolving. She states it is a locking up sensation. It would mostly occur when turning to the right. Patient also mentions occasional balance problems which she attributes to "situational vertigo" when she will turn or going down and up.   She was previously taking B6 for about 6 months (started in 12/2020) and is currently taking centrum silver that she  states has a lot of B6. She only took it for 1 month.   EtOH: none Restrictive diet: No Family history of neurologic problems: No  Most recent Assessment and Plan (11/16/21): Patient had many symptoms that she was worried about in addition to the tongue burning including shoulder pain with weakness in right arm (since resolved) and occasional low back pain/spasms. I do not think any of these symptoms are related to one another. Her normal neurologic exam makes a brain or spine pathology as the etiology of her symptoms very  unlikely. Her tongue is strong, making a lesion of the glossopharyngeal nerve unlikely. Symptoms do not match a trigeminal distribution nor do they sound consistent with trigeminal neuralgia. Pain secondary to herpes lesion (cold sore) is possible, though she has not seen lesions during most episodes and she can suppress the pain by touching her tongue. Other possibilities include burning mouth syndrome or persistent idiopathic facial pain. A dental cause is also possible, but given that she does not have tooth or gum pain, this may be less likely. Patient has previously not tolerated gabapentin, so I will try amitriptyline. I will start at a very low dose and attempt to titrate up to symptom relief given patient's concerns for side effects.   PLAN: -Blood work: B6, B12 -Start amitriptyline 10 mg qhs for 1 week, if no side effects, go to 20 mg daily  Since their last visit: Labs were significant for an elevated B6 (47.3). B12 was normal. Patient stopped taking any supplements with B6.  She is taking amitriptyline 20 mg daily. Patient continues to have symptoms, without much change. She would like to wean off of this medication. Patient is concerned for tardive dyskinesia.  She has noticed that the spot that hurt looked inflamed. There was no clear sore.   She mentions that her gum touches her mouth and where the gum touches her mouth is where it tends to hurt. She is going to stop her gum that she always uses. She also is changing her toothpaste.  MEDICATIONS:  Outpatient Encounter Medications as of 04/19/2022  Medication Sig   Acetaminophen-DM (DELSYM CHILD COUGH+SORE THROAT PO) Take 5 mLs by mouth as needed (COUGH).   amitriptyline (ELAVIL) 10 MG tablet TAKE 1 TABLET BY MOUTH ONCE DAILY AT BEDTIME FOR 7 DAYS THEN 2 AT BEDTIME   atorvastatin (LIPITOR) 80 MG tablet Take 1 tablet (80 mg total) by mouth at bedtime.   cetirizine (ZYRTEC) 10 MG tablet    Cholecalciferol (VITAMIN D3) 25 MCG (1000 UT)  CAPS Take 1,000 Units by mouth daily.   furosemide (LASIX) 20 MG tablet    hydrochlorothiazide (HYDRODIURIL) 12.5 MG tablet Take 12.5 mg by mouth daily.   loratadine (CLARITIN) 10 MG tablet Take 10 mg by mouth daily as needed for allergies.   losartan (COZAAR) 100 MG tablet Take 100 mg by mouth daily.   omeprazole (PRILOSEC) 20 MG capsule Take 20 mg by mouth as needed (INDIGESTION).   Pseudoephedrine HCl 30 MG CAPA Take 30 mg by mouth as needed (SINUS CONGESTION / HEAD ACHE).   LORazepam (ATIVAN) 0.5 MG tablet Take 1 tablet by mouth as needed (ANXIETY).   Multiple Vitamins-Minerals (CENTRUM SILVER PO) Take 1 tablet by mouth daily. 1 tab po qd (Patient not taking: Reported on 12/31/2021)   No facility-administered encounter medications on file as of 04/19/2022.    PAST MEDICAL HISTORY: Past Medical History:  Diagnosis Date   Anxiety state, unspecified    CHEST PAIN UNSPECIFIED  Essential hypertension, benign    MITRAL VALVE PROLAPSE    Mixed hyperlipidemia     PAST SURGICAL HISTORY: Past Surgical History:  Procedure Laterality Date   APPENDECTOMY     CARDIAC CATHETERIZATION     SPINE SURGERY     TUBAL LIGATION      ALLERGIES: Allergies  Allergen Reactions   Bactrim [Sulfamethoxazole-Trimethoprim]     INTOLERANCE NAUSEA   Codeine    Minocycline Hcl    Cefdinir Hives, Itching and Rash    FAMILY HISTORY: Family History  Problem Relation Age of Onset   Coronary artery disease Other     SOCIAL HISTORY: Social History   Tobacco Use   Smoking status: Never   Smokeless tobacco: Never  Vaping Use   Vaping Use: Never used  Substance Use Topics   Alcohol use: No   Drug use: No   Social History   Social History Narrative     Lives in [place], [place] with her husband.        She is employed with a [employer] in [place].  She is a        nonsmoker, nondrinker and has moderate to heavy caffeine intake in the        form of sodas.  (07/06/2008)       Objective:  Vital Signs:  BP (!) 173/75   Pulse 88   Ht 5' 7"$  (1.702 m)   Wt 170 lb (77.1 kg)   SpO2 93%   BMI 26.63 kg/m   General: No acute distress.  Patient appears well-groomed.   Head:  Normocephalic/atraumatic Neurological Exam: alert and oriented to person, place, and time.  Speech fluent and not dysarthric, language intact.  CN II-XII intact. There is possible tongue fasciculations vs quivering, but no atrophy or weakness of tongue. Bulk and tone normal, muscle strength 5/5 throughout.  Sensation to light touch intact.  Deep tendon reflexes 2+ throughout.  Gait normal.   Labs and Imaging review: New results: 11/16/21: B6: 47.3 B12: 688  Previously reviewed results: Normal or unremarkable: BMP, Mg   Recent Labs        Lab Results  Component Value Date    HGBA1C   07/02/2008      5.5 (NOTE) The ADA recommends the following therapeutic goal for glycemic control related to Hgb A1c measurement: Goal of therapy: <6.5 Hgb A1c  Reference: American Diabetes Association: Clinical Practice Recommendations 2010, Diabetes Care, 2010, 33: (Suppl  1).      Recent Labs[]$ Expand by Default       Lab Results  Component Value Date    TSH 1.710 02/23/2021      Cervical spine xrays (05/01/10): Findings: The patient is status post ACDF at C4-5.  The fusion  appears mature.  Progressive end plate changes are present adjacent  the fusion at C3-4 and C5-6.  Degenerative endplate changes present  at C6-7.  Osseous foraminal narrowing is present on the left at C5-  6.  Uncovertebral disease is asymmetric to the left at that level.   IMPRESSION:   1.  Asymmetric left-sided uncovertebral disease and osseous  foraminal narrowing at C5-6.  2.  Status post anterior fusion at C4-5.  3.  Progressive spondylosis  is present at C4-5, C5-6, and C6-7.  Assessment/Plan:  This is [name], a 77 y.o. female with tongue pain and spasms. The etiology of her symptoms is unclear. There  is possible fasciculations of her tongue (vs quivering) on exam  today, but no atrophy or weakness. There are no clear lesions in her mouth, but focal areas of pain. She did not tolerate gabapentin, lyrica, or amitriptyline. 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  Plan: -Blood work: B6, ESR -Will wean of amitriptyline as requested by patient. She will take 10 mg for 1 week then stop -Start alpha lipoic acid 600 mg daily -Discussed clonazepam. Will try alpha lipoic acid first per patient preference.   Return to clinic in 3 months  Total time spent reviewing records, interview, history/exam, documentation, and coordination of care on day of encounter:  40 min  Kai Levins, MD

## 2022-04-15 ENCOUNTER — Encounter: Payer: Self-pay | Admitting: Neurology

## 2022-04-17 DIAGNOSIS — E7800 Pure hypercholesterolemia, unspecified: Secondary | ICD-10-CM | POA: Diagnosis not present

## 2022-04-17 DIAGNOSIS — R7303 Prediabetes: Secondary | ICD-10-CM | POA: Diagnosis not present

## 2022-04-17 DIAGNOSIS — I10 Essential (primary) hypertension: Secondary | ICD-10-CM | POA: Diagnosis not present

## 2022-04-19 ENCOUNTER — Encounter: Payer: Self-pay | Admitting: Neurology

## 2022-04-19 ENCOUNTER — Ambulatory Visit (INDEPENDENT_AMBULATORY_CARE_PROVIDER_SITE_OTHER): Payer: PPO | Admitting: Neurology

## 2022-04-19 ENCOUNTER — Other Ambulatory Visit (INDEPENDENT_AMBULATORY_CARE_PROVIDER_SITE_OTHER): Payer: PPO

## 2022-04-19 VITALS — BP 168/72 | HR 88 | Ht 67.0 in | Wt 170.0 lb

## 2022-04-19 DIAGNOSIS — R208 Other disturbances of skin sensation: Secondary | ICD-10-CM

## 2022-04-19 DIAGNOSIS — K146 Glossodynia: Secondary | ICD-10-CM | POA: Diagnosis not present

## 2022-04-19 DIAGNOSIS — K148 Other diseases of tongue: Secondary | ICD-10-CM | POA: Diagnosis not present

## 2022-04-19 DIAGNOSIS — R519 Headache, unspecified: Secondary | ICD-10-CM

## 2022-04-19 LAB — SEDIMENTATION RATE: Sed Rate: 2 mm/hr (ref 0–30)

## 2022-04-19 NOTE — Patient Instructions (Signed)
I will recheck some labs today.  We will wean off your amitriptyline as requested. Take 1 capsule, 10 mg daily for 1 week, then stop.  You can try alpha lipoic acid 600 mg daily. This is a natural supplement and can be bought over the counter or online.  I like your idea of eliminating potential triggers such as gum or toothpaste that could be irritating your mouth as well.  I would like to see you back in clinic in 3 months to re-evaluate your symptoms.  Please let me know if you have any questions or concerns in the meantime.  The physicians and staff at [facility] are committed to providing excellent care. You may receive a survey requesting feedback about your experience at our office. We strive to receive "very good" responses to the survey questions. If you feel that your experience would prevent you from giving the office a "very good " response, please contact our office to try to remedy the situation. We may be reached at [phone]. Thank you for taking the time out of your busy day to complete the survey.  Kai Levins, MD [facility]

## 2022-04-24 ENCOUNTER — Encounter: Payer: Self-pay | Admitting: Neurology

## 2022-04-24 LAB — VITAMIN B6: Vitamin B6: 7 ng/mL (ref 2.1–21.7)

## 2022-06-08 DIAGNOSIS — J209 Acute bronchitis, unspecified: Secondary | ICD-10-CM | POA: Diagnosis not present

## 2022-06-08 DIAGNOSIS — J0110 Acute frontal sinusitis, unspecified: Secondary | ICD-10-CM | POA: Diagnosis not present

## 2022-06-08 DIAGNOSIS — R059 Cough, unspecified: Secondary | ICD-10-CM | POA: Diagnosis not present

## 2022-07-22 NOTE — Progress Notes (Signed)
NEUROLOGY FOLLOW UP OFFICE NOTE  [name] [id number]  Subjective:  [name] is a 77 y.o. year old female with a medical history of HTN, HLD, GERD, cervical spondylosis with radiculopathy (s/p ACDF at C4-5 about 17 years ago), SVT, and anxiety who we last saw on 04/19/22.  To briefly review: Patient started having symptoms in her tongue in 10/2020. She had an episode of her tongue deviating out of her mouth to the right. She also had a burning sensation on the left side of her tongue that she could improve by touching her tongue. It would last < 5 seconds and stop after touching it. The burning sensation began happening more frequently with burning on both sides and the tip of the tongue in 01/2021. Per referring provider documentation, patient was seen by Dr. Lovell Sheehan at [facility] on 08/31/21 and mentioned ongoing tongue spasms and pain. She had a cervical spine MRI with C3 and C6 neural foraminal stenosis. She has tried gabapentin 300 mg TID without relief (started in 04/2021). The burning episodes increased. She thinks it was more on gabapentin, so she decreased the gabapentin. Her episodes decreased as she decreased gabapentin. She was also having difficulty with vision. She is currently taking gabapentin 100 mg daily (took last dose today). She has a detailed list of episodes since 01/2021 and has at least 100 episodes documented. The longest lasting episode was about 30 seconds.   She does not see any trigger that causes the episodes. They can happen when she is just laying in bed.   Patient felt like she might have a sore in her mouth (raw on the edge, but she didn't see anything). She did notice a lesion under her tongue this past week. She put ointment on it and it is clearing up. There was no associated tongue symptoms with this. There is sometimes associated radiation to bilateral jaws and to the ear canal (~ 10 times). Overall, the symptoms are not  disabling. She is able to keep doing what she is doing.   Per patient, she had noticed that her tongue had white crust. She was wondering about thrush. This has improved with changing tooth paste and mouth wash.   She denies eye watering, headaches, vision changes, or changes to taste.   She has not seen a dentist since her mouth symptoms began.   In 03/2021 patient had new symptoms. Patient noticed shoulder pain on right. OTC treatment did not help. She was seen by PA who gave Celebrex and Flexeril. She saw ortho who also agreed with shoulder pain. Lidocaine patch was also added. She noticed weakness in her right hand about 1 week later (seemed more like finger flexors). She still had pain. She was told by ortho that this was related to her cervical spine. She was sent to PT for 12 weeks. The pain resolved and her strength is almost all the way back. The shoulder issues kept her from seeking care about her tongue.   Patient also mentions acute low back pain that would make her buckle at the knees. This lasted about 1 week before resolving. She states it is a locking up sensation. It would mostly occur when turning to the right. Patient also mentions occasional balance problems which she attributes to "situational vertigo" when she will turn or going down and up.   She was previously taking B6 for about 6 months (started in 12/2020) and is currently taking centrum silver that she  states has a lot of B6. She only took it for 1 month.   EtOH: none Restrictive diet: No Family history of neurologic problems: No  04/19/22: Labs were significant for an elevated B6 (47.3). B12 was normal. Patient stopped taking any supplements with B6.   She is taking amitriptyline 20 mg daily. Patient continues to have symptoms, without much change. She would like to wean off of this medication. Patient is concerned for tardive dyskinesia.   She has noticed that the spot that hurt looked inflamed. There was no clear  sore.    She mentions that her gum touches her mouth and where the gum touches her mouth is where it tends to hurt. She is going to stop her gum that she always uses. She also is changing her toothpaste.  Most recent Assessment and Plan (04/19/22): This is [name], a 77 y.o. female with tongue pain and spasms. The etiology of her symptoms is unclear. There is possible fasciculations of her tongue (vs quivering) on exam today, but no atrophy or weakness. There are no clear lesions in her mouth, but focal areas of pain. She did not tolerate gabapentin, lyrica, or amitriptyline. 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   Plan: -Blood work: B6, ESR -Will wean of amitriptyline as requested by patient. She will take 10 mg for 1 week then stop -Start alpha lipoic acid 600 mg daily -Discussed clonazepam. Will try alpha lipoic acid first per patient preference.   Since their last visit: Patient's symptoms are similar than prior. She had a good April, but had bad episode in May where she had irritation of left tongue radiating to left face. She has only had one bad episode since last clinic visit. Her longest attack was 30 seconds.  Patient has stopped amitriptyline. Patient started alpha lipoic acid 600 mg daily. She thinks that this has helped reduce the frequency of attacks. She switched toothpaste. She stopped mouthwash. She has changed the gum she uses. She is going to try a gel to help with dry mouth as well.  MEDICATIONS:  Outpatient Encounter Medications as of 07/31/2022  Medication Sig   albuterol (VENTOLIN HFA) 108 (90 Base) MCG/ACT inhaler SMARTSIG:2 Puff(s) By Mouth 4 Times Daily PRN   Alpha-Lipoic Acid 600 MG CAPS    atorvastatin (LIPITOR) 80 MG tablet Take 1 tablet (80 mg total) by mouth at bedtime.   Cholecalciferol (VITAMIN D3) 25 MCG (1000 UT) CAPS Take 1,000 Units by mouth daily.   diclofenac  Sodium (VOLTAREN) 1 % GEL    fluticasone (FLONASE) 50 MCG/ACT nasal spray Place into both nostrils daily.   furosemide (LASIX) 20 MG tablet    hydrochlorothiazide (HYDRODIURIL) 12.5 MG tablet Take 12.5 mg by mouth daily.   losartan (COZAAR) 100 MG tablet Take 100 mg by mouth daily.   MULTIPLE VITAMIN PO 1 tablet Orally Once a day   omeprazole (PRILOSEC) 20 MG capsule Take 20 mg by mouth as needed (INDIGESTION).   Pseudoephedrine HCl 30 MG CAPA Take 30 mg by mouth as needed (SINUS CONGESTION / HEAD ACHE).   benzonatate (TESSALON) 200 MG capsule  (Patient not taking: Reported on 07/31/2022)   [DISCONTINUED] Acetaminophen-DM (DELSYM CHILD COUGH+SORE THROAT PO) Take 5 mLs by mouth as needed (COUGH).   [DISCONTINUED] cetirizine (ZYRTEC) 10 MG tablet    [DISCONTINUED] loratadine (CLARITIN) 10 MG tablet Take 10 mg by mouth daily as needed for allergies.  No facility-administered encounter medications on file as of 07/31/2022.    PAST MEDICAL HISTORY: Past Medical History:  Diagnosis Date   Anxiety state, unspecified    CHEST PAIN UNSPECIFIED    Essential hypertension, benign    Mixed hyperlipidemia     PAST SURGICAL HISTORY: Past Surgical History:  Procedure Laterality Date   APPENDECTOMY     CARDIAC CATHETERIZATION     SPINE SURGERY     TUBAL LIGATION      ALLERGIES: Allergies  Allergen Reactions   Bactrim [Sulfamethoxazole-Trimethoprim]     INTOLERANCE NAUSEA   Codeine    Minocycline Hcl    Cefdinir Hives, Itching and Rash    FAMILY HISTORY: Family History  Problem Relation Age of Onset   Heart Problems Mother    Heart Problems Father    Heart Problems Sister    Coronary artery disease Other     SOCIAL HISTORY: Social History   Tobacco Use   Smoking status: Never   Smokeless tobacco: Never  Vaping Use   Vaping Use: Never used  Substance Use Topics   Alcohol use: No   Drug use: No   Social History   Social History Narrative     [place]     She is employed with a [employer] in [place].  She is a     nonsmoker, nondrinker and has moderate to heavy caffeine intake in the     form of sodas.  (07/06/2008)   Are you right handed or left handed? right   Are you currently employed ? no   What is your current occupation? retired   Do you live at home alone? No    Who lives with you? Husband   What type of home do you live in: 1 story or 2 story? two    Caffeine rarely      Objective:  Vital Signs:  BP 125/60   Pulse 88   Resp 16   Ht 5\' 7"  (1.702 m)   Wt 167 lb 3.2 oz (75.8 kg)   SpO2 99%   BMI 26.19 kg/m   General: No acute distress.  Patient appears well-groomed.   Head:  Normocephalic/atraumatic Neck: supple, no paraspinal tenderness, full range of motion Neurological Exam: alert and oriented.  Speech fluent and not dysarthric, language intact.  CN II-XII intact. Tongue appears normal today. Bulk and tone normal, muscle strength 5/5 throughout.  Sensation to light touch intact.  Deep tendon reflexes 2+ throughout.  Finger to nose testing intact.  Gait normal.  Labs and Imaging review: New results: 04/19/22: ESR wnl (2) B6 wnl (7)  Previously reviewed results: 11/16/21: B6: 47.3 B12: 688   Normal or unremarkable: BMP, Mg   Recent Labs             Lab Results  Component Value Date    HGBA1C   07/02/2008      5.5 (NOTE) The ADA recommends the following therapeutic goal for glycemic control related to Hgb A1c measurement: Goal of therapy: <6.5 Hgb A1c  Reference: American Diabetes Association: Clinical Practice Recommendations 2010, Diabetes Care, 2010, 33: (Suppl  1).      Recent Labs[] Expand by Default           Lab Results  Component Value Date    TSH 1.710 02/23/2021      Cervical spine xrays (05/01/10): Findings: The patient is status post ACDF at C4-5.  The fusion  appears  mature.  Progressive end plate changes are present adjacent  the fusion at C3-4 and C5-6.   Degenerative endplate changes present  at C6-7.  Osseous foraminal narrowing is present on the left at C5-  6.  Uncovertebral disease is asymmetric to the left at that level.   IMPRESSION:   1.  Asymmetric left-sided uncovertebral disease and osseous  foraminal narrowing at C5-6.  2.  Status post anterior fusion at C4-5.  3.  Progressive spondylosis  is present at C4-5, C5-6, and C6-7.    Assessment/Plan:  This is [name], a 77 y.o. female with tongue pain (sharp cutting and burning) and spasms. The etiology remains unclear. We discussed that symptoms could be consistent with cold sores or canker sores as treatment with campho phenique has been helpful in the past. Patient was interested in this possibility and will discuss further with PCP. This may also be burning mouth syndrome or another atypical face pain. Overall, symptoms have decreased in frequency and do not last more than a few seconds usually.   Plan: -Continue alpha lipoic acid 600 mg daily -Discussed clonazepam. Patient would like to continue to hold off on this and try more conservative measures, which is reasonable. -Discussed cold sores/canker sore as possible etiology. Patient to discuss with PCP.  Return to clinic as needed  Total time spent reviewing records, interview, history/exam, documentation, and coordination of care on day of encounter:  30 min  Jacquelyne Balint, MD

## 2022-07-31 ENCOUNTER — Ambulatory Visit: Payer: PPO | Admitting: Neurology

## 2022-07-31 ENCOUNTER — Encounter: Payer: Self-pay | Admitting: Neurology

## 2022-07-31 VITALS — BP 125/60 | HR 88 | Resp 16 | Ht 67.0 in | Wt 167.2 lb

## 2022-07-31 DIAGNOSIS — K146 Glossodynia: Secondary | ICD-10-CM

## 2022-07-31 DIAGNOSIS — R519 Headache, unspecified: Secondary | ICD-10-CM | POA: Diagnosis not present

## 2022-07-31 DIAGNOSIS — R208 Other disturbances of skin sensation: Secondary | ICD-10-CM

## 2022-07-31 DIAGNOSIS — K148 Other diseases of tongue: Secondary | ICD-10-CM

## 2022-07-31 NOTE — Patient Instructions (Addendum)
Continue alpha lipoic acid 600 mg daily.  Discuss cold sores or canker sores with primary care doctors.  Follow up with needed.  The physicians and staff at [facility] are committed to providing excellent care. You may receive a survey requesting feedback about your experience at our office. We strive to receive "very good" responses to the survey questions. If you feel that your experience would prevent you from giving the office a "very good " response, please contact our office to try to remedy the situation. We may be reached at [phone]. Thank you for taking the time out of your busy day to complete the survey.  Jacquelyne Balint, MD [facility]

## 2022-09-19 IMAGING — CT CT HEART MORP W/ CTA COR W/ SCORE W/ CA W/CM &/OR W/O CM
4 of 7 series · 8 of 20 positions shown, 9 images · IV contrast (APPLIED)
Comparison: Prior coronary calcium score study on 03/01/2020
COMPARISON: Prior coronary calcium score study on 03/01/2020

Addendum:
EXAM:
OVER-READ INTERPRETATION  CT CHEST

The following report is an over-read performed by radiologist Dr.
over-read does not include interpretation of cardiac or coronary
anatomy or pathology. The coronary CTA interpretation by the
cardiologist is attached.
CLINICAL DATA: Chest pain
Cardiac/Coronary  CTA
TECHNIQUE: The patient was scanned on a Phillips Force scanner.

[Series 6: ts diast sharp · axial · 0.41mm/px · z∈[-235,-194]mm · 2 of 313 slices shown]
[im 105/313  lung]
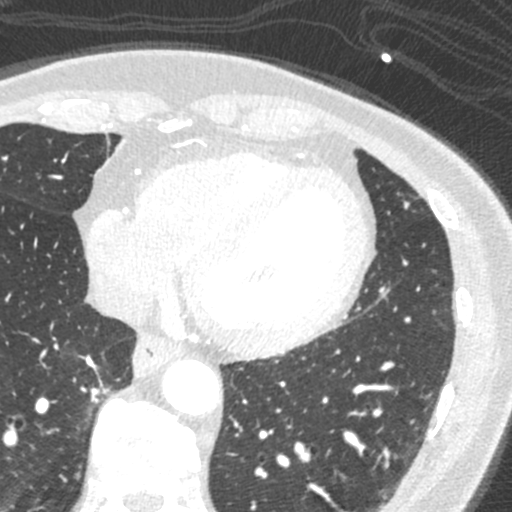
[im 209/313  lung]
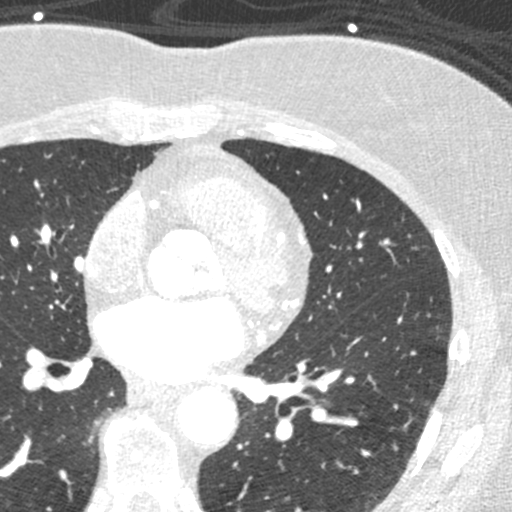

[Series 7: ts syst sharp · axial · 0.41mm/px · z∈[-235,-194]mm · 2 of 313 slices shown]
[im 105/313  lung]
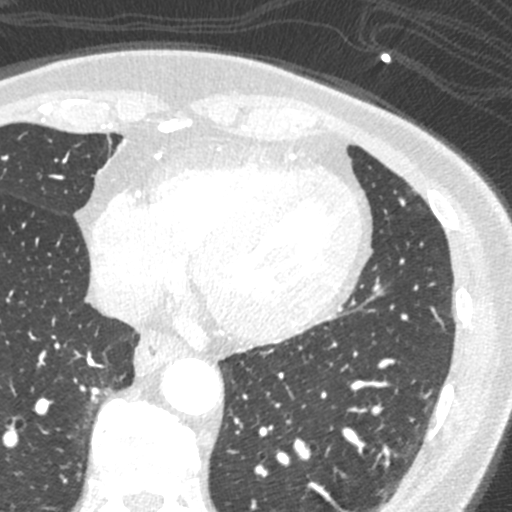
[im 209/313  lung]
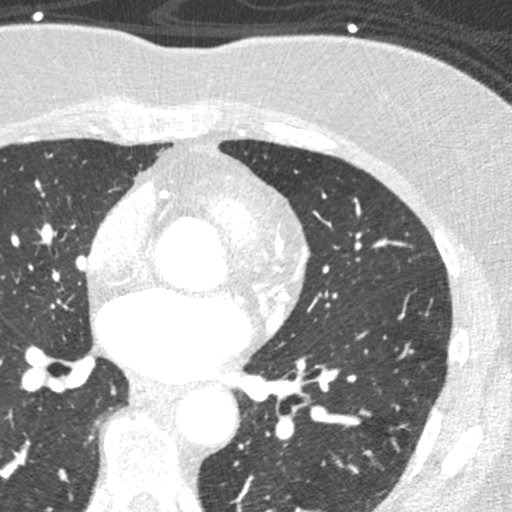

[Series 8: best diast · axial · 0.41mm/px · z∈[-235,-194]mm · 2 of 313 slices shown, 3 images]
[im 105/313  vessel]
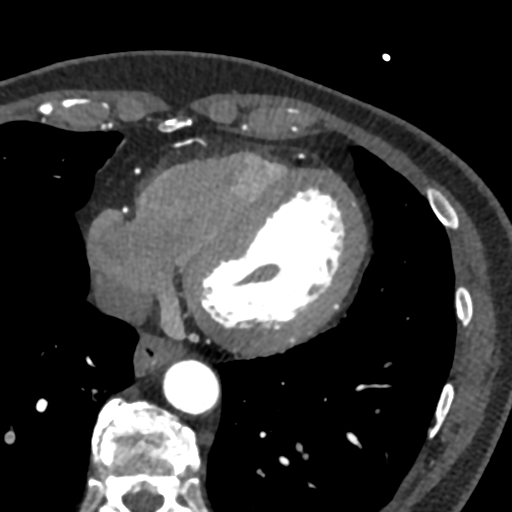
[im 105/313  lung]
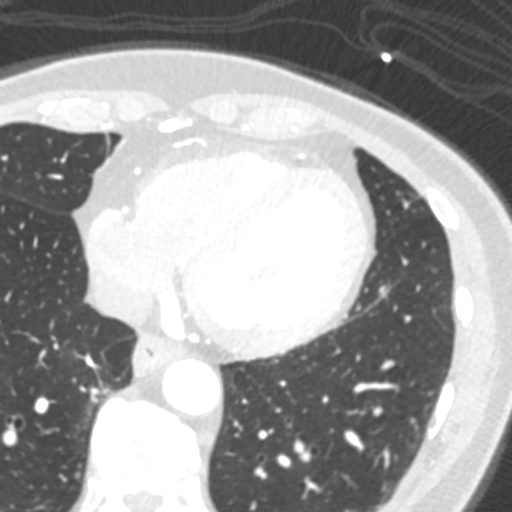
[im 209/313  vessel]
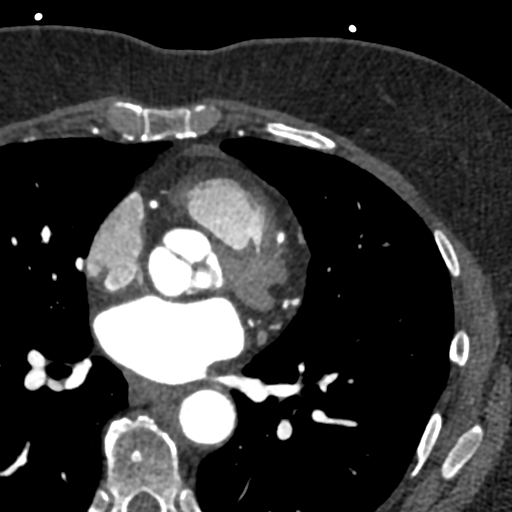

[Series 9: best syst · axial · 0.41mm/px · z∈[-235,-194]mm · 2 of 313 slices shown]
[im 105/313  vessel]
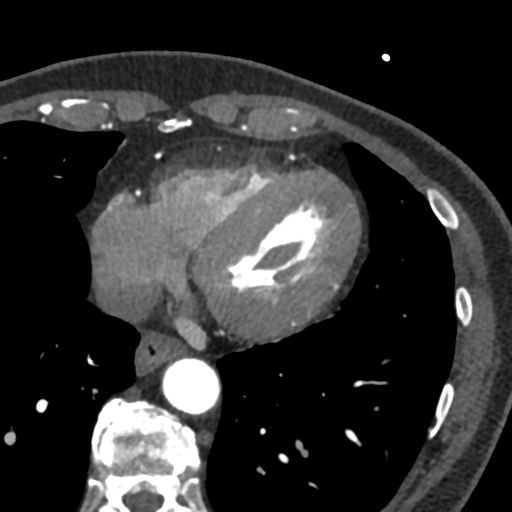
[im 209/313  vessel]
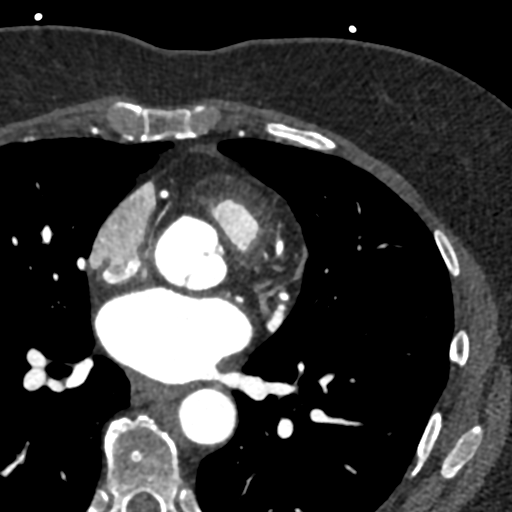

[8 of 20 positions shown; findings below may reference images not displayed]

FINDINGS: Vascular: No significant noncardiac vascular findings.

Mediastinum/Nodes: Visualized mediastinum and hilar regions
demonstrate no lymphadenopathy or masses. There is a small hiatal
hernia.

Lungs/Pleura: Visualized lungs show no evidence of pulmonary edema,
consolidation, pneumothorax, nodule or pleural fluid.

Upper Abdomen: No acute abnormality.

Musculoskeletal: No chest wall mass or suspicious bone lesions
identified.
IMPRESSION: Small hiatal hernia.
FINDINGS: A 120 kV prospective scan was triggered in the descending thoracic
aorta at 111 HU's. Axial non-contrast 3 mm slices were carried out
through the heart. The data set was analyzed on a dedicated work
station and scored using the Agatson method. Gantry rotation speed
was 250 msecs and collimation was .6 mm. 0.8 mg of sl NTG was given.
The 3D data set was reconstructed in 5% intervals of the 67-82 % of
the R-R cycle. Diastolic phases were analyzed on a dedicated work
station using MPR, MIP and VRT modes. The patient received 80 cc of
contrast.

Image quality: good

Aorta:  Normal size.  No calcifications.  No dissection.

Aortic Valve:  Trileaflet.  No calcifications.

Coronary Arteries:  Normal coronary origin.  Right dominance.

RCA is a large dominant artery that gives rise to PDA and PLA. There
is no plaque.

Left main is a large artery that gives rise to LAD and LCX arteries.

LAD is a large vessel that has calcified ostial/proximal plaque
0-24%.

LCX is a non-dominant artery that gives rise to one large OM1
branch. There is no plaque.

Other findings:

Normal pulmonary vein drainage into the left atrium.

Normal left atrial appendage without a thrombus.

Normal size of the pulmonary artery.

Please see radiology report for non cardiac findings.
IMPRESSION: 1. Coronary calcium score of 98. This was 65 percentile for age and
sex matched control.

2. Normal coronary origin with right dominance.

3. LAD is a large vessel that has calcified ostial/proximal plaque
0-24% (stenosis). Otherwise normal.

4. CAD-RADS 1. Minimal non-obstructive CAD (0-24%). Consider
non-atherosclerotic causes of chest pain. Consider preventive
therapy and risk factor modification.

*** End of Addendum ***
EXAM:
OVER-READ INTERPRETATION  CT CHEST

The following report is an over-read performed by radiologist Dr.
over-read does not include interpretation of cardiac or coronary
anatomy or pathology. The coronary CTA interpretation by the
cardiologist is attached.
FINDINGS: Vascular: No significant noncardiac vascular findings.

Mediastinum/Nodes: Visualized mediastinum and hilar regions
demonstrate no lymphadenopathy or masses. There is a small hiatal
hernia.

Lungs/Pleura: Visualized lungs show no evidence of pulmonary edema,
consolidation, pneumothorax, nodule or pleural fluid.

Upper Abdomen: No acute abnormality.

Musculoskeletal: No chest wall mass or suspicious bone lesions
identified.
IMPRESSION: Small hiatal hernia.

## 2022-11-28 DIAGNOSIS — Z1231 Encounter for screening mammogram for malignant neoplasm of breast: Secondary | ICD-10-CM | POA: Diagnosis not present

## 2022-11-30 DIAGNOSIS — J0100 Acute maxillary sinusitis, unspecified: Secondary | ICD-10-CM | POA: Diagnosis not present

## 2022-11-30 DIAGNOSIS — J209 Acute bronchitis, unspecified: Secondary | ICD-10-CM | POA: Diagnosis not present

## 2022-12-13 DIAGNOSIS — Z01419 Encounter for gynecological examination (general) (routine) without abnormal findings: Secondary | ICD-10-CM | POA: Diagnosis not present

## 2023-01-08 DIAGNOSIS — R7303 Prediabetes: Secondary | ICD-10-CM | POA: Diagnosis not present

## 2023-01-08 DIAGNOSIS — I10 Essential (primary) hypertension: Secondary | ICD-10-CM | POA: Diagnosis not present

## 2023-01-13 DIAGNOSIS — I700 Atherosclerosis of aorta: Secondary | ICD-10-CM | POA: Diagnosis not present

## 2023-01-13 DIAGNOSIS — M7062 Trochanteric bursitis, left hip: Secondary | ICD-10-CM | POA: Diagnosis not present

## 2023-01-13 DIAGNOSIS — Z23 Encounter for immunization: Secondary | ICD-10-CM | POA: Diagnosis not present

## 2023-01-13 DIAGNOSIS — Z0000 Encounter for general adult medical examination without abnormal findings: Secondary | ICD-10-CM | POA: Diagnosis not present

## 2023-01-13 DIAGNOSIS — R42 Dizziness and giddiness: Secondary | ICD-10-CM | POA: Diagnosis not present

## 2023-01-13 DIAGNOSIS — I2510 Atherosclerotic heart disease of native coronary artery without angina pectoris: Secondary | ICD-10-CM | POA: Diagnosis not present

## 2023-01-13 DIAGNOSIS — K200 Eosinophilic esophagitis: Secondary | ICD-10-CM | POA: Diagnosis not present

## 2023-01-13 DIAGNOSIS — I10 Essential (primary) hypertension: Secondary | ICD-10-CM | POA: Diagnosis not present

## 2023-01-13 DIAGNOSIS — K140 Glossitis: Secondary | ICD-10-CM | POA: Diagnosis not present

## 2023-01-13 DIAGNOSIS — R7303 Prediabetes: Secondary | ICD-10-CM | POA: Diagnosis not present

## 2023-01-13 DIAGNOSIS — E559 Vitamin D deficiency, unspecified: Secondary | ICD-10-CM | POA: Diagnosis not present

## 2023-02-24 DIAGNOSIS — E7800 Pure hypercholesterolemia, unspecified: Secondary | ICD-10-CM | POA: Diagnosis not present

## 2023-02-24 DIAGNOSIS — E789 Disorder of lipoprotein metabolism, unspecified: Secondary | ICD-10-CM | POA: Diagnosis not present

## 2023-02-24 DIAGNOSIS — Z860101 Personal history of adenomatous and serrated colon polyps: Secondary | ICD-10-CM | POA: Diagnosis not present

## 2023-02-24 DIAGNOSIS — R1319 Other dysphagia: Secondary | ICD-10-CM | POA: Diagnosis not present

## 2023-02-24 DIAGNOSIS — K200 Eosinophilic esophagitis: Secondary | ICD-10-CM | POA: Diagnosis not present

## 2023-04-10 DIAGNOSIS — K5140 Inflammatory polyps of colon without complications: Secondary | ICD-10-CM | POA: Diagnosis not present

## 2023-04-10 DIAGNOSIS — K200 Eosinophilic esophagitis: Secondary | ICD-10-CM | POA: Diagnosis not present

## 2023-04-10 DIAGNOSIS — K635 Polyp of colon: Secondary | ICD-10-CM | POA: Diagnosis not present

## 2023-04-10 DIAGNOSIS — Z860101 Personal history of adenomatous and serrated colon polyps: Secondary | ICD-10-CM | POA: Diagnosis not present

## 2023-04-10 DIAGNOSIS — K2289 Other specified disease of esophagus: Secondary | ICD-10-CM | POA: Diagnosis not present

## 2023-04-10 DIAGNOSIS — K5730 Diverticulosis of large intestine without perforation or abscess without bleeding: Secondary | ICD-10-CM | POA: Diagnosis not present

## 2023-04-10 DIAGNOSIS — K222 Esophageal obstruction: Secondary | ICD-10-CM | POA: Diagnosis not present

## 2023-04-10 DIAGNOSIS — K449 Diaphragmatic hernia without obstruction or gangrene: Secondary | ICD-10-CM | POA: Diagnosis not present

## 2023-04-10 DIAGNOSIS — R1310 Dysphagia, unspecified: Secondary | ICD-10-CM | POA: Diagnosis not present

## 2023-04-10 DIAGNOSIS — Z09 Encounter for follow-up examination after completed treatment for conditions other than malignant neoplasm: Secondary | ICD-10-CM | POA: Diagnosis not present

## 2023-04-10 DIAGNOSIS — K3189 Other diseases of stomach and duodenum: Secondary | ICD-10-CM | POA: Diagnosis not present

## 2023-04-10 DIAGNOSIS — D125 Benign neoplasm of sigmoid colon: Secondary | ICD-10-CM | POA: Diagnosis not present

## 2023-04-14 DIAGNOSIS — M19072 Primary osteoarthritis, left ankle and foot: Secondary | ICD-10-CM | POA: Diagnosis not present

## 2023-04-14 DIAGNOSIS — K635 Polyp of colon: Secondary | ICD-10-CM | POA: Diagnosis not present

## 2023-04-14 DIAGNOSIS — K5140 Inflammatory polyps of colon without complications: Secondary | ICD-10-CM | POA: Diagnosis not present

## 2023-04-14 DIAGNOSIS — M7672 Peroneal tendinitis, left leg: Secondary | ICD-10-CM | POA: Diagnosis not present

## 2023-04-14 DIAGNOSIS — D125 Benign neoplasm of sigmoid colon: Secondary | ICD-10-CM | POA: Diagnosis not present

## 2023-04-14 DIAGNOSIS — K2289 Other specified disease of esophagus: Secondary | ICD-10-CM | POA: Diagnosis not present

## 2023-04-28 DIAGNOSIS — R2689 Other abnormalities of gait and mobility: Secondary | ICD-10-CM | POA: Diagnosis not present

## 2023-04-28 DIAGNOSIS — M25572 Pain in left ankle and joints of left foot: Secondary | ICD-10-CM | POA: Diagnosis not present

## 2023-04-28 DIAGNOSIS — M6281 Muscle weakness (generalized): Secondary | ICD-10-CM | POA: Diagnosis not present

## 2023-05-02 DIAGNOSIS — M6281 Muscle weakness (generalized): Secondary | ICD-10-CM | POA: Diagnosis not present

## 2023-05-02 DIAGNOSIS — M25572 Pain in left ankle and joints of left foot: Secondary | ICD-10-CM | POA: Diagnosis not present

## 2023-05-02 DIAGNOSIS — R2689 Other abnormalities of gait and mobility: Secondary | ICD-10-CM | POA: Diagnosis not present

## 2023-05-07 DIAGNOSIS — R2689 Other abnormalities of gait and mobility: Secondary | ICD-10-CM | POA: Diagnosis not present

## 2023-05-07 DIAGNOSIS — M6281 Muscle weakness (generalized): Secondary | ICD-10-CM | POA: Diagnosis not present

## 2023-05-07 DIAGNOSIS — M25572 Pain in left ankle and joints of left foot: Secondary | ICD-10-CM | POA: Diagnosis not present

## 2023-05-09 DIAGNOSIS — M6281 Muscle weakness (generalized): Secondary | ICD-10-CM | POA: Diagnosis not present

## 2023-05-09 DIAGNOSIS — M25572 Pain in left ankle and joints of left foot: Secondary | ICD-10-CM | POA: Diagnosis not present

## 2023-05-09 DIAGNOSIS — R2689 Other abnormalities of gait and mobility: Secondary | ICD-10-CM | POA: Diagnosis not present

## 2023-05-12 DIAGNOSIS — M6281 Muscle weakness (generalized): Secondary | ICD-10-CM | POA: Diagnosis not present

## 2023-05-12 DIAGNOSIS — R2689 Other abnormalities of gait and mobility: Secondary | ICD-10-CM | POA: Diagnosis not present

## 2023-05-12 DIAGNOSIS — M25572 Pain in left ankle and joints of left foot: Secondary | ICD-10-CM | POA: Diagnosis not present

## 2023-05-14 DIAGNOSIS — M6281 Muscle weakness (generalized): Secondary | ICD-10-CM | POA: Diagnosis not present

## 2023-05-14 DIAGNOSIS — R2689 Other abnormalities of gait and mobility: Secondary | ICD-10-CM | POA: Diagnosis not present

## 2023-05-14 DIAGNOSIS — M25572 Pain in left ankle and joints of left foot: Secondary | ICD-10-CM | POA: Diagnosis not present

## 2023-05-20 DIAGNOSIS — M6281 Muscle weakness (generalized): Secondary | ICD-10-CM | POA: Diagnosis not present

## 2023-05-20 DIAGNOSIS — M25572 Pain in left ankle and joints of left foot: Secondary | ICD-10-CM | POA: Diagnosis not present

## 2023-05-20 DIAGNOSIS — R2689 Other abnormalities of gait and mobility: Secondary | ICD-10-CM | POA: Diagnosis not present

## 2023-05-23 DIAGNOSIS — R2689 Other abnormalities of gait and mobility: Secondary | ICD-10-CM | POA: Diagnosis not present

## 2023-05-23 DIAGNOSIS — M25572 Pain in left ankle and joints of left foot: Secondary | ICD-10-CM | POA: Diagnosis not present

## 2023-05-23 DIAGNOSIS — M6281 Muscle weakness (generalized): Secondary | ICD-10-CM | POA: Diagnosis not present

## 2023-05-26 DIAGNOSIS — M7672 Peroneal tendinitis, left leg: Secondary | ICD-10-CM | POA: Diagnosis not present

## 2023-05-26 DIAGNOSIS — M13872 Other specified arthritis, left ankle and foot: Secondary | ICD-10-CM | POA: Diagnosis not present

## 2023-05-27 DIAGNOSIS — R2689 Other abnormalities of gait and mobility: Secondary | ICD-10-CM | POA: Diagnosis not present

## 2023-05-27 DIAGNOSIS — M25572 Pain in left ankle and joints of left foot: Secondary | ICD-10-CM | POA: Diagnosis not present

## 2023-05-27 DIAGNOSIS — M6281 Muscle weakness (generalized): Secondary | ICD-10-CM | POA: Diagnosis not present

## 2023-05-30 DIAGNOSIS — R2689 Other abnormalities of gait and mobility: Secondary | ICD-10-CM | POA: Diagnosis not present

## 2023-05-30 DIAGNOSIS — M6281 Muscle weakness (generalized): Secondary | ICD-10-CM | POA: Diagnosis not present

## 2023-05-30 DIAGNOSIS — M25572 Pain in left ankle and joints of left foot: Secondary | ICD-10-CM | POA: Diagnosis not present

## 2023-06-03 DIAGNOSIS — M6281 Muscle weakness (generalized): Secondary | ICD-10-CM | POA: Diagnosis not present

## 2023-06-03 DIAGNOSIS — M25572 Pain in left ankle and joints of left foot: Secondary | ICD-10-CM | POA: Diagnosis not present

## 2023-06-03 DIAGNOSIS — R2689 Other abnormalities of gait and mobility: Secondary | ICD-10-CM | POA: Diagnosis not present

## 2023-06-06 DIAGNOSIS — R2689 Other abnormalities of gait and mobility: Secondary | ICD-10-CM | POA: Diagnosis not present

## 2023-06-06 DIAGNOSIS — M25572 Pain in left ankle and joints of left foot: Secondary | ICD-10-CM | POA: Diagnosis not present

## 2023-06-06 DIAGNOSIS — M6281 Muscle weakness (generalized): Secondary | ICD-10-CM | POA: Diagnosis not present

## 2023-06-10 DIAGNOSIS — M25572 Pain in left ankle and joints of left foot: Secondary | ICD-10-CM | POA: Diagnosis not present

## 2023-06-10 DIAGNOSIS — R2689 Other abnormalities of gait and mobility: Secondary | ICD-10-CM | POA: Diagnosis not present

## 2023-06-10 DIAGNOSIS — M6281 Muscle weakness (generalized): Secondary | ICD-10-CM | POA: Diagnosis not present

## 2023-06-13 DIAGNOSIS — M25572 Pain in left ankle and joints of left foot: Secondary | ICD-10-CM | POA: Diagnosis not present

## 2023-06-13 DIAGNOSIS — M6281 Muscle weakness (generalized): Secondary | ICD-10-CM | POA: Diagnosis not present

## 2023-06-13 DIAGNOSIS — R2689 Other abnormalities of gait and mobility: Secondary | ICD-10-CM | POA: Diagnosis not present

## 2023-12-01 DIAGNOSIS — Z1231 Encounter for screening mammogram for malignant neoplasm of breast: Secondary | ICD-10-CM | POA: Diagnosis not present

## 2024-01-15 DIAGNOSIS — I10 Essential (primary) hypertension: Secondary | ICD-10-CM | POA: Diagnosis not present

## 2024-01-15 DIAGNOSIS — E789 Disorder of lipoprotein metabolism, unspecified: Secondary | ICD-10-CM | POA: Diagnosis not present

## 2024-01-15 DIAGNOSIS — R7303 Prediabetes: Secondary | ICD-10-CM | POA: Diagnosis not present

## 2024-01-15 LAB — LAB REPORT - SCANNED
A1c: 5.7
Albumin, Urine POC: 3.4
Creatinine, POC: 25.4 mg/dL
EGFR: 72
Microalb Creat Ratio: 13

## 2024-01-20 DIAGNOSIS — K200 Eosinophilic esophagitis: Secondary | ICD-10-CM | POA: Diagnosis not present

## 2024-01-20 DIAGNOSIS — Z0000 Encounter for general adult medical examination without abnormal findings: Secondary | ICD-10-CM | POA: Diagnosis not present

## 2024-01-20 DIAGNOSIS — R7303 Prediabetes: Secondary | ICD-10-CM | POA: Diagnosis not present

## 2024-01-20 DIAGNOSIS — I10 Essential (primary) hypertension: Secondary | ICD-10-CM | POA: Diagnosis not present

## 2024-01-20 DIAGNOSIS — M79605 Pain in left leg: Secondary | ICD-10-CM | POA: Diagnosis not present

## 2024-01-20 DIAGNOSIS — E789 Disorder of lipoprotein metabolism, unspecified: Secondary | ICD-10-CM | POA: Diagnosis not present

## 2024-01-20 DIAGNOSIS — Z23 Encounter for immunization: Secondary | ICD-10-CM | POA: Diagnosis not present

## 2024-01-20 DIAGNOSIS — R7989 Other specified abnormal findings of blood chemistry: Secondary | ICD-10-CM | POA: Diagnosis not present

## 2024-01-20 DIAGNOSIS — I700 Atherosclerosis of aorta: Secondary | ICD-10-CM | POA: Diagnosis not present

## 2024-01-20 DIAGNOSIS — R42 Dizziness and giddiness: Secondary | ICD-10-CM | POA: Diagnosis not present

## 2024-01-20 DIAGNOSIS — R600 Localized edema: Secondary | ICD-10-CM | POA: Diagnosis not present

## 2024-01-20 DIAGNOSIS — I2510 Atherosclerotic heart disease of native coronary artery without angina pectoris: Secondary | ICD-10-CM | POA: Diagnosis not present

## 2024-01-22 ENCOUNTER — Ambulatory Visit: Payer: Self-pay | Admitting: Cardiology

## 2024-02-26 NOTE — Progress Notes (Unsigned)
° °   Ben Jackson D.[facility] [address] [place] [address] Phone: [phone]   Assessment and Plan:     ***    Pertinent previous records reviewed include ***   Follow Up: ***     Subjective:   I, [name], am serving as a neurosurgeon for Doctor Morene Mace  Chief Complaint: left leg pain   HPI:   02/27/2024 Patient is a 78 year old female with left leg pain. Patient states   Relevant Historical Information: ***  Additional pertinent review of systems negative.  Current Medications[1]   Objective:     There were no vitals filed for this visit.    There is no height or weight on file to calculate BMI.    Physical Exam:    ***   Electronically signed by:  Odis Mace D.[facility] 7:25 AM 02/26/2024    [1]  Current Outpatient Medications:    albuterol (VENTOLIN HFA) 108 (90 Base) MCG/ACT inhaler, SMARTSIG:2 Puff(s) By Mouth 4 Times Daily PRN, Disp: , Rfl:    Alpha-Lipoic Acid 600 MG CAPS, , Disp: , Rfl:    atorvastatin  (LIPITOR) 80 MG tablet, Take 1 tablet (80 mg total) by mouth at bedtime., Disp: 90 tablet, Rfl: 3   benzonatate (TESSALON) 200 MG capsule, , Disp: , Rfl:    Cholecalciferol (VITAMIN D3) 25 MCG (1000 UT) CAPS, Take 1,000 Units by mouth daily., Disp: , Rfl:    diclofenac Sodium (VOLTAREN) 1 % GEL, , Disp: , Rfl:    fluticasone (FLONASE) 50 MCG/ACT nasal spray, Place into both nostrils daily., Disp: , Rfl:    furosemide  (LASIX ) 20 MG tablet, , Disp: , Rfl:    hydrochlorothiazide (HYDRODIURIL) 12.5 MG tablet, Take 12.5 mg by mouth daily., Disp: , Rfl:    losartan  (COZAAR ) 100 MG tablet, Take 100 mg by mouth daily., Disp: , Rfl:    MULTIPLE VITAMIN PO, 1 tablet Orally Once a day, Disp: , Rfl:    omeprazole (PRILOSEC) 20 MG capsule, Take 20 mg by mouth as needed (INDIGESTION)., Disp: , Rfl:    Pseudoephedrine HCl 30 MG CAPA, Take 30 mg by mouth as needed (SINUS CONGESTION /  HEAD ACHE)., Disp: , Rfl:

## 2024-02-27 ENCOUNTER — Ambulatory Visit

## 2024-02-27 ENCOUNTER — Ambulatory Visit: Admitting: Sports Medicine

## 2024-02-27 VITALS — BP 138/78 | HR 68 | Ht 67.0 in | Wt 167.0 lb

## 2024-02-27 DIAGNOSIS — M79605 Pain in left leg: Secondary | ICD-10-CM

## 2024-02-27 DIAGNOSIS — M7062 Trochanteric bursitis, left hip: Secondary | ICD-10-CM | POA: Diagnosis not present

## 2024-02-27 DIAGNOSIS — M25572 Pain in left ankle and joints of left foot: Secondary | ICD-10-CM

## 2024-02-27 DIAGNOSIS — G8929 Other chronic pain: Secondary | ICD-10-CM | POA: Diagnosis not present

## 2024-02-27 DIAGNOSIS — M25562 Pain in left knee: Secondary | ICD-10-CM

## 2024-02-27 DIAGNOSIS — M25552 Pain in left hip: Secondary | ICD-10-CM

## 2024-02-27 DIAGNOSIS — M5442 Lumbago with sciatica, left side: Secondary | ICD-10-CM

## 2024-02-27 DIAGNOSIS — M25372 Other instability, left ankle: Secondary | ICD-10-CM

## 2024-02-27 MED ORDER — MELOXICAM 15 MG PO TABS: ORAL_TABLET | ORAL | 0 refills | Status: AC

## 2024-02-27 NOTE — Patient Instructions (Addendum)
 Back HEP   - Start meloxicam  15 mg daily x2 weeks.  If still having pain after 2 weeks, complete 3rd-week of NSAID. May use remaining NSAID as needed once daily for pain control.  Do not to use additional over-the-counter NSAIDs (ibuprofen, naproxen, Advil, Aleve, etc.) while taking prescription NSAIDs.  May use Tylenol [phone] mg 2 to 3 times a day for breakthrough pain.  PT referral   6 week follow up

## 2024-03-07 ENCOUNTER — Emergency Department (HOSPITAL_COMMUNITY)

## 2024-03-07 ENCOUNTER — Other Ambulatory Visit: Payer: Self-pay

## 2024-03-07 ENCOUNTER — Emergency Department (HOSPITAL_COMMUNITY)
Admission: EM | Admit: 2024-03-07 | Discharge: 2024-03-08 | Disposition: A | Discharge status: Home / Self Care | Attending: Emergency Medicine | Admitting: Emergency Medicine

## 2024-03-07 ENCOUNTER — Encounter (HOSPITAL_COMMUNITY): Payer: Self-pay

## 2024-03-07 DIAGNOSIS — I160 Hypertensive urgency: Secondary | ICD-10-CM | POA: Diagnosis not present

## 2024-03-07 DIAGNOSIS — I10 Essential (primary) hypertension: Secondary | ICD-10-CM | POA: Diagnosis not present

## 2024-03-07 DIAGNOSIS — R519 Headache, unspecified: Secondary | ICD-10-CM | POA: Diagnosis present

## 2024-03-07 DIAGNOSIS — Z79899 Other long term (current) drug therapy: Secondary | ICD-10-CM | POA: Diagnosis not present

## 2024-03-07 DIAGNOSIS — E876 Hypokalemia: Secondary | ICD-10-CM | POA: Diagnosis not present

## 2024-03-07 LAB — BASIC METABOLIC PANEL WITH GFR
Anion gap: 12 (ref 5–15)
BUN: 20 mg/dL (ref 8–23)
CO2: 27 mmol/L (ref 22–32)
Calcium: 9.5 mg/dL (ref 8.9–10.3)
Chloride: 103 mmol/L (ref 98–111)
Creatinine, Ser: 0.78 mg/dL (ref 0.44–1.00)
GFR, Estimated: >60 mL/min
Glucose, Bld: 84 mg/dL (ref 70–99)
Potassium: 3.4 mmol/L — ABNORMAL LOW (ref 3.5–5.1)
Sodium: 142 mmol/L (ref 135–145)

## 2024-03-07 LAB — CBC
HCT: 42.4 % (ref 36.0–46.0)
Hemoglobin: 14.1 g/dL (ref 12.0–15.0)
MCH: 30 pg (ref 26.0–34.0)
MCHC: 33.3 g/dL (ref 30.0–36.0)
MCV: 90.2 fL (ref 80.0–100.0)
Platelets: 250 K/uL (ref 150–400)
RBC: 4.7 MIL/uL (ref 3.87–5.11)
RDW: 13.8 % (ref 11.5–15.5)
WBC: 8.6 K/uL (ref 4.0–10.5)
nRBC: 0 % (ref 0.0–0.2)

## 2024-03-07 LAB — TROPONIN T, HIGH SENSITIVITY
Troponin T High Sensitivity: <15 ng/L (ref 0–19)
Troponin T High Sensitivity: <15 ng/L (ref 0–19)

## 2024-03-07 NOTE — ED Provider Triage Note (Signed)
 Emergency Medicine Provider Triage Evaluation Note  [name] , a 78 y.o. female  was evaluated in triage.  Pt complains of hypertension, tingling in right arm.  Patient felt off this morning, when she was checking her blood pressure at home she was found to have elevated BP with systolic readings in the 190s despite use of her antihypertensive medications.  On the way to the hospital she began to develop some tingling in her right arm, no personal cardiac history however extensive family history.  Denies chest pain.  Review of Systems  Positive: As above Negative: As above  Physical Exam  BP (!) 192/98   Pulse 70   Temp 98.4 F (36.9 C)   Resp 18   SpO2 100%  Gen:   Awake, no distress   Resp:  Normal effort  MSK:   Moves extremities without difficulty  Other:    Medical Decision Making  Medically screening exam initiated at 1:26 PM.  Appropriate orders placed.  [name] was informed that the remainder of the evaluation will be completed by another provider, this initial triage assessment does not replace that evaluation, and the importance of remaining in the ED until their evaluation is complete.     [name], [place] 03/07/24 1327

## 2024-03-07 NOTE — ED Triage Notes (Signed)
 Pt stated she woke up this morning and didn't feel right, she checked her BP and it was fluctuating between 160-190 systolic.  Pt currently c.o feeling drained, foggy, achiness in her right arm. Denies headache or dizziness.

## 2024-03-07 NOTE — ED Notes (Signed)
 Pt spoke with ED tech and stated that her BP was high and that she is worried. ED tech attempted to explain the ED flow to pt and stated that the longest ED wait time was 12.5 hours and climbing. ED charge aware of pt situation.

## 2024-03-07 NOTE — Progress Notes (Unsigned)
 " Cardiology Office Note:    Date:  03/09/2024   ID:  [name], DOB 1945-07-02, MRN [id number]  PCP:  Clarice Nottingham, MD  Cardiologist:  Lonni LITTIE Nanas, MD  Electrophysiologist:  None   Referring MD: Clarice Nottingham, MD   Chief Complaint  Patient presents with   Hypertension    History of Present Illness:    [name] is a 78 y.o. female with a hx of mitral valve prolapse, hypertension, hyperlipidemia who presents for follow-up.  She was initially seen on 01/31/2020 for evaluation of chest pain.  She previously followed with Dr. Delford, last seen in 2012.  She had normal heart catheterization in 2010.  Stress echo in 2012 showed no evidence of ischemia, normal systolic function.  She reports she has been having chest pain that she describes as burning/tightness in center of her chest, that occurs when she is stressed.  She walks twice per week for 3.5 miles, denies any exertional chest pain.  Chest pain only occurs when she is under significant stress.  States that this past year was homeschooling her 2 year old granddaughter and would frequently have the chest pain at that time.  Reports episodes can last for up to 2 minutes.  Does report a history of esophageal spasm as well.  Also has been having rare palpitations.  Denies any lower extremity edema.  Reports occasional lightheadedness that has been attributed to vertigo, denies any syncope.  Does report she has been having burning/numbness in her legs.  No smoking history.  Family history includes mother died of MI at age 74, father had MI in 62s, and brother died of MI in 9s.  Echocardiogram on 03/01/2020 shows normal biventricular function, no significant valvular disease.  ABIs on 03/01/2020 were normal.  Lexiscan  Myoview  on 03/02/2020 was normal.  Coronary calcium  score of 71 (62nd percentile) on 03/01/2020.  Subsequently reported she was having chest pain and underwent coronary CTA on 07/13/2021 which showed nonobstructive CAD  (calcified plaque in ostial/proximal LAD causing 0 to 24% stenosis), calcium  score 98 (65th percentile).  Also noted to have small hiatal hernia.  Since last clinic visit, she reports she is doing okay.  She was seen in the ED yesterday, had BP up to 196/99.  Was feeling unwell.  She was given a dose of amlodipine  5 mg in the ED but was not prescribed anything on discharge.  She report has been having some shortness of breath.  She denies any chest pain.  Reports some lightheadedness with standing, denies any syncope.  She is having palpitations where she feels like her heart is skipping beats, occurs multiple times per week.  Past Medical History:  Diagnosis Date   Anxiety state, unspecified    CHEST PAIN UNSPECIFIED    Essential hypertension, benign    Mixed hyperlipidemia     Current Medications: Current Meds  Medication Sig   albuterol (VENTOLIN HFA) 108 (90 Base) MCG/ACT inhaler SMARTSIG:2 Puff(s) By Mouth 4 Times Daily PRN   amLODipine  (NORVASC ) 5 MG tablet Take 1 tablet (5 mg total) by mouth daily.   atorvastatin  (LIPITOR) 80 MG tablet Take 1 tablet (80 mg total) by mouth at bedtime.   benzonatate (TESSALON) 200 MG capsule    Cholecalciferol (VITAMIN D3) 25 MCG (1000 UT) CAPS Take 1,000 Units by mouth daily.   diclofenac Sodium (VOLTAREN) 1 % GEL    fluticasone (FLONASE) 50 MCG/ACT nasal spray Place into both nostrils daily.   furosemide  (LASIX ) 20 MG tablet  hydrochlorothiazide  (HYDRODIURIL ) 12.5 MG tablet Take 12.5 mg by mouth daily.   losartan  (COZAAR ) 100 MG tablet Take 100 mg by mouth daily.   meloxicam  (MOBIC ) 15 MG tablet Take 1 tablet daily for 2 weeks.  If still in pain after 2 weeks, take 1 tablet daily for an additional 1 week.   MULTIPLE VITAMIN PO 1 tablet Orally Once a day   omeprazole (PRILOSEC) 20 MG capsule Take 20 mg by mouth as needed (INDIGESTION).   potassium chloride  SA (KLOR-CON  M20) 20 MEQ tablet Take 1 tablet (20 mEq total) by mouth daily.    Pseudoephedrine HCl 30 MG CAPA Take 30 mg by mouth as needed (SINUS CONGESTION / HEAD ACHE).     Allergies:   Bactrim [sulfamethoxazole-trimethoprim], Codeine, Minocycline hcl, and Cefdinir   Social History   Socioeconomic History   Marital status: Married    Spouse name: Not on file   Number of children: Not on file   Years of education: Not on file   Highest education level: Not on file  Occupational History   Not on file  Tobacco Use   Smoking status: Never   Smokeless tobacco: Never  Vaping Use   Vaping status: Never Used  Substance and Sexual Activity   Alcohol use: No   Drug use: No   Sexual activity: Yes  Other Topics Concern   Not on file  Social History Narrative     Lives in [place], [place]  with her husband.     She is employed with a [employer] in [place].  She is a     nonsmoker, nondrinker and has moderate to heavy caffeine intake in the     form of sodas.  (07/06/2008)   Are you right handed or left handed? right   Are you currently employed ? no   What is your current occupation? retired   Do you live at home alone? No    Who lives with you? Husband   What type of home do you live in: 1 story or 2 story? two    Caffeine rarely   Social Drivers of Health   Tobacco Use: Low Risk (03/09/2024)   Patient History    Smoking Tobacco Use: Never    Smokeless Tobacco Use: Never    Passive Exposure: Not on file  Financial Resource Strain: Not on file  Food Insecurity: Not on file  Transportation Needs: Not on file  Physical Activity: Not on file  Stress: Not on file  Social Connections: Not on file  Depression (EYV7-0): Not on file  Alcohol Screen: Not on file  Housing: Not on file  Utilities: Not on file  Health Literacy: Not on file     Family History: The patient's family history includes Coronary artery disease in an other family member; Heart Problems in her father, mother, and sister.  ROS:   Please see the history of present  illness.     All other systems reviewed and are negative.  EKGs/Labs/Other Studies Reviewed:    The following studies were reviewed today:   EKG:   07/02/21: Sinus bradycardia, rate 56, no ST abnormality 12/31/21: NSR, rate 77, no St abnormality 03/08/24: sinus brady, rate 51, PVCs  Recent Labs: 03/07/2024: BUN 20; Creatinine, Ser 0.78; Hemoglobin 14.1; Platelets 250; Potassium 3.4; Sodium 142  Recent Lipid Panel    Component Value Date/Time   CHOL 145 07/02/2021 1021   TRIG 155 (H) 07/02/2021 1021   HDL 46 07/02/2021 1021  CHOLHDL 3.2 07/02/2021 1021   CHOLHDL 3.7 07/02/2008 0230   VLDL 23 07/02/2008 0230   LDLCALC 72 07/02/2021 1021    Physical Exam:    VS:  BP (!) 142/60 (BP Location: Left Arm, Patient Position: Sitting, Cuff Size: Normal)   Pulse 68   Ht 5' 7 (1.702 m)   Wt 164 lb 3.2 oz (74.5 kg)   SpO2 98%   BMI 25.72 kg/m     Wt Readings from Last 3 Encounters:  03/09/24 164 lb 3.2 oz (74.5 kg)  02/27/24 167 lb (75.8 kg)  07/31/22 167 lb 3.2 oz (75.8 kg)     GEN: Well nourished, well developed in no acute distress HEENT: Normal NECK: No JVD; No carotid bruits LYMPHATICS: No lymphadenopathy CARDIAC: RRR, 2/6 systolic murmur RESPIRATORY:  Clear to auscultation without rales, wheezing or rhonchi  ABDOMEN: Soft, non-tender, non-distended MUSCULOSKELETAL:  No edema; No deformity  SKIN: Warm and dry NEUROLOGIC:  Alert and oriented x 3 PSYCHIATRIC:  Normal affect   ASSESSMENT:    1. Palpitations   2. PVC's (premature ventricular contractions)   3. Coronary artery disease involving native coronary artery of native heart without angina pectoris   4. Essential hypertension   5. Hyperlipidemia, unspecified hyperlipidemia type   6. Hypokalemia       PLAN:    Chest pain: Description concerning for angina, as describes substernal chest pressure occurring when stressed.  She had normal heart catheterization in 2010 and negative stress echo in 2012.   Echocardiogram on 03/01/2020 shows normal biventricular function, no significant valvular disease.  Lexiscan  Myoview  on 03/02/2020 was normal.  Underwent coronary CTA on 07/13/2021 which showed nonobstructive CAD (calcified plaque in ostial/proximal LAD causing 0 to 24% stenosis), calcium  score 98 (65th percentile).  Also noted to have small hiatal hernia. -She reports chest pain has improved, no further work-up at this time  Palpitations: PVCs noted on EKG in ED yesterday, her description of palpitations suggests PVCs.  Recommend Zio patch x 7 days to quantify PVC burden  Bradycardia: Reports resting heart rate in 40s to 50s and has felt lightheadedness.  Discontinued metoprolol.  She wore cardiac monitor x14 days 12/2020, showed no significant arrhythmias, occasional PACs (1.5% of beats) -Appears resolved  Mitral valve prolapse: Reported history.  Systolic murmur on exam.  Echocardiogram on 03/01/2020 showed no evidence of mitral prolapse.  Leg pain: ABIs on 03/01/2020 were normal.    Hyperlipidemia: LDL 79 on 01/15/24 on atorvastatin  80 mg daily.  Calcium  score 98 (65th percentile) on 07/13/2021.   Hypertension: On hydrochlorothiazide  12.5 mg daily, losartan  100 mg daily.  BP elevated, will add amlodipine  5 mg daily.  Asked to check BP daily for next 2 weeks and let us  know results.  Hypokalemia: Potassium 3.4 in the ED yesterday.  Likely due to HCTZ use.  Had potassium chloride  20 mEq daily and check BMET/mag in 1 week   RTC in 3 months     Medication Adjustments/Labs and Tests Ordered: Current medicines are reviewed at length with the patient today.  Concerns regarding medicines are outlined above.  Orders Placed This Encounter  Procedures   Basic Metabolic Panel (BMET)   Magnesium   LONG TERM MONITOR (3-14 DAYS)   Meds ordered this encounter  Medications   amLODipine  (NORVASC ) 5 MG tablet    Sig: Take 1 tablet (5 mg total) by mouth daily.    Dispense:  90 tablet    Refill:  1    potassium chloride  SA (KLOR-CON   M20) 20 MEQ tablet    Sig: Take 1 tablet (20 mEq total) by mouth daily.    Dispense:  90 tablet    Refill:  1    Patient Instructions  Medication Instructions:  START Norvasc  (amlodipine ) 5mg  Take 1 tablet once a day  START Potassium 20meqs Take 1 tablet once a day  *If you need a refill on your cardiac medications before your next appointment, please call your pharmacy*  Lab Work: COMPLETE IN 1 WEEK-BMET & MAG (03/15/24) If you have labs (blood work) drawn today and your tests are completely normal, you will receive your results only by: MyChart Message (if you have MyChart) OR A paper copy in the mail If you have any lab test that is abnormal or we need to change your treatment, we will call you to review the results.  Testing/Procedures: GEOFFRY HEWS- Long Term Monitor Instructions  Your physician has requested you wear a ZIO patch monitor for 7 days.  This is a single patch monitor. [employer] supplies one patch monitor per enrollment. Additional stickers are not available. Please do not apply patch if you will be having a Nuclear Stress Test,  Echocardiogram, Cardiac CT, MRI, or Chest Xray during the period you would be wearing the  monitor. The patch cannot be worn during these tests. You cannot remove and re-apply the  ZIO XT patch monitor.  Your ZIO patch monitor will be mailed 3 day USPS to your address on file. It may take 3-5 days  to receive your monitor after you have been enrolled.  Once you have received your monitor, please review the enclosed instructions. Your monitor  has already been registered assigning a specific monitor serial # to you.  Billing and Patient Assistance Program Information  We have supplied [employer] with any of your insurance information on file for billing purposes. [employer] offers a sliding scale Patient Assistance Program for patients that do not have  insurance, or whose insurance does not completely cover the cost of  the ZIO monitor.  You must apply for the Patient Assistance Program to qualify for this discounted rate.  To apply, please call [employer] at [phone], select option 4, select option 2, ask to apply for  Patient Assistance Program. Meredeth will ask your household income, and how many people  are in your household. They will quote your out-of-pocket cost based on that information.  [employer] will also be able to set up a 63-month, interest-free payment plan if needed.  Applying the monitor   Shave hair from upper left chest.  Hold abrader disc by orange tab. Rub abrader in 40 strokes over the upper left chest as  indicated in your monitor instructions.  Clean area with 4 enclosed alcohol pads. Let dry.  Apply patch as indicated in monitor instructions. Patch will be placed under collarbone on left  side of chest with arrow pointing upward.  Rub patch adhesive wings for 2 minutes. Remove white label marked 1. Remove the white  label marked 2. Rub patch adhesive wings for 2 additional minutes.  While looking in a mirror, press and release button in center of patch. A small green light will  flash 3-4 times. This will be your only indicator that the monitor has been turned on.  Do not shower for the first 24 hours. You may shower after the first 24 hours.  Press the button if you feel a symptom. You will hear a small click. Record Date, Time and  Symptom in the  Patient Logbook.  When you are ready to remove the patch, follow instructions on the last 2 pages of Patient  Logbook. Stick patch monitor onto the last page of Patient Logbook.  Place Patient Logbook in the blue and white box. Use locking tab on box and tape box closed  securely. The blue and white box has prepaid postage on it. Please place it in the mailbox as  soon as possible. Your physician should have your test results approximately 7 days after the  monitor has been mailed back to [address].  Call [facility] Customer  Care at [phone] if you have questions regarding  your ZIO XT patch monitor. Call them immediately if you see an orange light blinking on your  monitor.  If your monitor falls off in less than 4 days, contact our Monitor department at [phone].  If your monitor becomes loose or falls off after 4 days call [employer] at [phone] for  suggestions on securing your monitor   Follow-Up: At [facility], you and your health needs are our priority.  As part of our continuing mission to provide you with exceptional heart care, our providers are all part of one team.  This team includes your primary Cardiologist (physician) and Advanced Practice Providers or APPs (Physician Assistants and Nurse Practitioners) who all work together to provide you with the care you need, when you need it.  Your next appointment:   3 month(s)  Provider:   Lonni LITTIE Nanas, MD    We recommend signing up for the patient portal called MyChart.  Sign up information is provided on this After Visit Summary.  MyChart is used to connect with patients for Virtual Visits (Telemedicine).  Patients are able to view lab/test results, encounter notes, upcoming appointments, etc.  Non-urgent messages can be sent to your provider as well.   To learn more about what you can do with MyChart, go to [email or web address].   Other Instructions Please check your blood pressure daily for two weeks, then contact the office with your readings  Please contact the office with your readings either by phone, by dropping it off in person, or by sending it through MyChart.   Be sure to check your blood pressure one to two hours after taking your medications.  Avoid the following for 30 minutes before checking your blood pressure: No caffeine No alcohol No eating No smoking  No exercise  Five minutes before checking your blood pressure: Use the restroom Sit up straight in a chair with your back supported and  feet flat on the floor Remain quiet and do not talk            Signed, Lonni LITTIE Nanas, MD  03/09/2024 12:08 PM    [facility] "

## 2024-03-08 ENCOUNTER — Emergency Department (HOSPITAL_COMMUNITY)

## 2024-03-08 ENCOUNTER — Ambulatory Visit: Payer: Self-pay | Admitting: Sports Medicine

## 2024-03-08 ENCOUNTER — Telehealth: Payer: Self-pay | Admitting: Cardiology

## 2024-03-08 MED ORDER — LOSARTAN POTASSIUM 50 MG PO TABS
100.0000 mg | ORAL_TABLET | Freq: Once | ORAL | Status: AC
  Administered 2024-03-08: 100 mg via ORAL
  Filled 2024-03-08: qty 2

## 2024-03-08 MED ORDER — AMLODIPINE BESYLATE 5 MG PO TABS
5.0000 mg | ORAL_TABLET | Freq: Once | ORAL | Status: AC
  Administered 2024-03-08: 5 mg via ORAL
  Filled 2024-03-08: qty 1

## 2024-03-08 MED ORDER — HYDROCHLOROTHIAZIDE 12.5 MG PO TABS
12.5000 mg | ORAL_TABLET | Freq: Once | ORAL | Status: AC
  Administered 2024-03-08: 12.5 mg via ORAL
  Filled 2024-03-08: qty 1

## 2024-03-08 MED ORDER — POTASSIUM CHLORIDE CRYS ER 20 MEQ PO TBCR
20.0000 meq | EXTENDED_RELEASE_TABLET | Freq: Once | ORAL | Status: AC
  Administered 2024-03-08: 20 meq via ORAL
  Filled 2024-03-08: qty 1

## 2024-03-08 NOTE — ED Provider Notes (Signed)
 " [place] [facility] AT [facility] Provider Note   CSN: [id number] Arrival date & time: 03/07/24  1202     Patient presents with: Hypertension   [name] is a 78 y.o. female.   HPI 78 year old female presents with hypertension.  Yesterday morning she felt generalized malaise/fatigue.  She ended up checking her blood pressure and throughout the morning it was between 160 and 200 systolic with the diastolic getting up to around 97.  She has not checked her blood pressure at home in a long time but typically at the doctor's office it will be around 130 systolic.  She has been compliant with her meds, most recently taking her HCTZ and Cozaar  yesterday.  No recent medication changes besides having some meloxicam  for sciatica.  She denies any chest pain but has felt some intermittent palpitations/skipped beats.  She denies any shortness of breath, vision changes, or focal weakness.  Yesterday on the way here for about 10 minutes in total she had an abnormal feeling in her right forearm.  It felt like a pain but maybe a tingling as well.  No weakness appreciated in that hand.  She has had a slight on and off headaches since 2 days ago.  She did have some on and off brief abdominal pain starting 3 and then 2 days ago but this has only occurred a couple times and not recurred.  Prior to Admission medications  Medication Sig Start Date End Date Taking? Authorizing Provider  albuterol (VENTOLIN HFA) 108 (90 Base) MCG/ACT inhaler SMARTSIG:2 Puff(s) By Mouth 4 Times Daily PRN    [provider]  Alpha-Lipoic Acid 600 MG CAPS  04/19/22   [provider]  atorvastatin  (LIPITOR) 80 MG tablet Take 1 tablet (80 mg total) by mouth at bedtime. 03/02/20   Kate Lonni CROME, MD  benzonatate (TESSALON) 200 MG capsule  06/08/22   [provider]  Cholecalciferol (VITAMIN D3) 25 MCG (1000 UT) CAPS Take 1,000 Units by mouth daily.    [provider]   diclofenac Sodium (VOLTAREN) 1 % GEL  03/01/22   [provider]  fluticasone (FLONASE) 50 MCG/ACT nasal spray Place into both nostrils daily.    [provider]  furosemide  (LASIX ) 20 MG tablet     [provider]  hydrochlorothiazide  (HYDRODIURIL ) 12.5 MG tablet Take 12.5 mg by mouth daily.    [provider]  losartan  (COZAAR ) 100 MG tablet Take 100 mg by mouth daily.    [provider]  meloxicam  (MOBIC ) 15 MG tablet Take 1 tablet daily for 2 weeks.  If still in pain after 2 weeks, take 1 tablet daily for an additional 1 week. 02/27/24   Leonce Katz, DO  MULTIPLE VITAMIN PO 1 tablet Orally Once a day    [provider]  omeprazole (PRILOSEC) 20 MG capsule Take 20 mg by mouth as needed (INDIGESTION).    [provider]  Pseudoephedrine HCl 30 MG CAPA Take 30 mg by mouth as needed (SINUS CONGESTION / HEAD ACHE).    [provider]    Allergies: Bactrim [sulfamethoxazole-trimethoprim], Codeine, Minocycline hcl, and Cefdinir    Review of Systems  Eyes:  Negative for visual disturbance.  Respiratory:  Negative for shortness of breath.   Cardiovascular:  Negative for chest pain.  Neurological:  Positive for headaches. Negative for weakness.    Updated Vital Signs BP (!) 189/86 (BP Location: Right Arm)   Pulse 66   Temp 98.1 F (  36.7 C) (Oral)   Resp 16   SpO2 98%   Physical Exam Vitals and nursing note reviewed.  Constitutional:      Appearance: She is well-developed.  HENT:     Head: Normocephalic and atraumatic.  Eyes:     Extraocular Movements: Extraocular movements intact.     Pupils: Pupils are equal, round, and reactive to light.  Cardiovascular:     Rate and Rhythm: Normal rate. Rhythm irregular.     Pulses:          Radial pulses are 2+ on the right side.     Heart sounds: Normal heart sounds.     Comments: Occasional skipped beats. Pulmonary:     Effort: Pulmonary effort is normal.      Breath sounds: Normal breath sounds.  Abdominal:     Palpations: Abdomen is soft.     Tenderness: There is no abdominal tenderness.  Skin:    General: Skin is warm and dry.  Neurological:     Mental Status: She is alert.     Comments: CN 3-12 grossly intact. 5/5 strength in all 4 extremities. Grossly normal sensation. Normal finger to nose.      (all labs ordered are listed, but only abnormal results are displayed) Labs Reviewed  BASIC METABOLIC PANEL WITH GFR - Abnormal; Notable for the following components:      Result Value   Potassium 3.4 (*)    All other components within normal limits  CBC  TROPONIN T, HIGH SENSITIVITY  TROPONIN T, HIGH SENSITIVITY    EKG: EKG Interpretation Date/Time:  Monday March 08 2024 08:57:11 EST Ventricular Rate:  51 PR Interval:  180 QRS Duration:  74 QT Interval:  424 QTC Calculation: 390 R Axis:   80  Text Interpretation: Sinus bradycardia with occasional Premature ventricular complexes and Fusion complexes Nonspecific ST and T wave abnormality no significant change since 2022 Confirmed by Freddi Hamilton [phone]) on 03/08/2024 9:07:41 AM  Radiology: MR BRAIN WO CONTRAST Result Date: 03/08/2024 EXAM: MRI BRAIN WITHOUT CONTRAST 03/08/2024 10:09:00 AM TECHNIQUE: Multiplanar multisequence MRI of the head/brain was performed without the administration of intravenous contrast. COMPARISON: None available. CLINICAL HISTORY: Neuro deficit, acute, stroke suspected; Transient ischemic attack (TIA). Hypertension. Right arm tingling. FINDINGS: BRAIN AND VENTRICLES: There is no evidence of an acute infarct, intracranial hemorrhage, mass, midline shift, hydrocephalus, or extra-axial fluid collection. T2 hyperintensities in the cerebral white matter bilaterally are nonspecific but compatible with mild chronic small vessel ischemic disease. Cerebral volume is within normal limits for age. Major intracranial vascular flow voids are preserved. ORBITS: Bilateral  cataract extraction. SINUSES AND MASTOIDS: No acute abnormality. BONES AND SOFT TISSUES: Normal marrow signal. No acute soft tissue abnormality. IMPRESSION: 1. No acute intracranial abnormality. 2. Mild chronic small vessel ischemic disease. Electronically signed by: Dasie Hamburg MD 03/08/2024 10:33 AM EST RP Workstation: [id number]   DG Chest 2 View Result Date: 03/07/2024 EXAM: 2 VIEW(S) XRAY OF THE CHEST 03/07/2024 01:56:00 PM COMPARISON: CXR 06/28/2020 CLINICAL HISTORY: R arm tingling with HTN FINDINGS: LUNGS AND PLEURA: No focal pulmonary opacity. No pleural effusion. No pneumothorax. HEART AND MEDIASTINUM: Aortic calcification. BONES AND SOFT TISSUES: Degenerative changes in spine. No acute osseous abnormality. IMPRESSION: 1. No acute findings. Electronically signed by: Morgane Naveau MD 03/07/2024 02:59 PM EST RP Workstation: [id number]     Procedures   Medications Ordered in the ED  potassium chloride  SA (KLOR-CON  M) CR tablet 20 mEq (20 mEq Oral Given 03/08/24 0928)  hydrochlorothiazide  (HYDRODIURIL ) tablet  12.5 mg (12.5 mg Oral Given 03/08/24 0928)  losartan  (COZAAR ) tablet 100 mg (100 mg Oral Given 03/08/24 0928)  amLODipine  (NORVASC ) tablet 5 mg (5 mg Oral Given 03/08/24 1123)                                    Medical Decision Making Amount and/or Complexity of Data Reviewed Labs:     Details: Mild hypokalemia, negative troponins. Radiology: ordered and independent interpretation performed.    Details: No stroke ECG/medicine tests: ordered and independent interpretation performed.    Details: No ischemia  Risk Prescription drug management.   I discussed with Dr. Michaela of neurology.  He is recommending an MRI though given that she had positive symptoms such as tingling/nonspecific pain in her right forearm it is less likely to be TIA.  If the MRI is negative he recommends baby aspirin and outpatient follow-up.  MRI is negative.  Blood pressure has waxed and waned.   She had 1 blood pressure 139/92 but I do not think this was truly accurate.  She will be given a dose of amlodipine .  She is actually seeing cardiology tomorrow so I think they can discuss potential medication changes at her visit there.  She otherwise feels asymptomatic at this time.  She feels well enough for discharge home.  She had the right arm nonspecific discomfort but no chest pain and her troponins are negative.  Appears stable for discharge home with return precautions.  Unclear how long this has been ongoing but I suspect this might have been a subacute rise in her blood pressure given she did not feel symptoms till yesterday.     Final diagnoses:  Hypertensive urgency    ED Discharge Orders     None          Freddi Hamilton, MD 03/08/24 1603  "

## 2024-03-08 NOTE — ED Notes (Signed)
 Dr. Freddi notified of blood pressure change prior to discharge. Amlodipine  to be held prior to DC. Discharge pending at this time.

## 2024-03-08 NOTE — Telephone Encounter (Signed)
 At time of triage review of this message, EDP note has been started as of 0846.   Will message patient in MyChart as it appears she is being evaluated

## 2024-03-08 NOTE — Discharge Instructions (Signed)
 Follow-up with your cardiologist tomorrow as scheduled.  Make sure you let Dr. Clarice know you are in the hospital as well.  You may need medication adjustment for your high blood pressure.  Start a baby aspirin for now and discuss this with your doctor as well.  If you develop headache, vision changes, weakness or numbness in the arms or legs, speech difficulty, chest pain, or any other new/concerning symptoms then return to the ER.

## 2024-03-08 NOTE — ED Notes (Signed)
 Pt cleared for discharge at this time by Dr. Freddi. Discharge instructions reviewed with patient. Follow up care and medications reviewed with pt. Pt verbalized understanding. Pt ambulatory at time of discharge, stable condition.

## 2024-03-08 NOTE — ED Notes (Signed)
 Patient transported to MRI

## 2024-03-08 NOTE — Telephone Encounter (Signed)
 Pt is at the ER due to high blood pressure. It has been towards 201/80, yesterday was 188/80. Pt has appt tomorrow with Dr. Kate and wanted him to know what has been going on. Please advise.

## 2024-03-08 NOTE — Telephone Encounter (Signed)
 Pt mentions she has been there since noon yestersday, and has still not seen a doctor. Requesting a c/b from office.

## 2024-03-09 ENCOUNTER — Ambulatory Visit

## 2024-03-09 ENCOUNTER — Ambulatory Visit: Attending: Cardiology | Admitting: Cardiology

## 2024-03-09 ENCOUNTER — Encounter: Payer: Self-pay | Admitting: Cardiology

## 2024-03-09 VITALS — BP 142/60 | HR 68 | Ht 67.0 in | Wt 164.2 lb

## 2024-03-09 DIAGNOSIS — R002 Palpitations: Secondary | ICD-10-CM

## 2024-03-09 DIAGNOSIS — I10 Essential (primary) hypertension: Secondary | ICD-10-CM

## 2024-03-09 DIAGNOSIS — E785 Hyperlipidemia, unspecified: Secondary | ICD-10-CM | POA: Diagnosis not present

## 2024-03-09 DIAGNOSIS — E876 Hypokalemia: Secondary | ICD-10-CM | POA: Diagnosis not present

## 2024-03-09 DIAGNOSIS — I2510 Atherosclerotic heart disease of native coronary artery without angina pectoris: Secondary | ICD-10-CM | POA: Diagnosis not present

## 2024-03-09 DIAGNOSIS — I493 Ventricular premature depolarization: Secondary | ICD-10-CM | POA: Diagnosis not present

## 2024-03-09 MED ORDER — AMLODIPINE BESYLATE 5 MG PO TABS: 5.0000 mg | ORAL_TABLET | Freq: Every day | ORAL | 1 refills | Status: DC

## 2024-03-09 MED ORDER — POTASSIUM CHLORIDE CRYS ER 20 MEQ PO TBCR: 20.0000 meq | EXTENDED_RELEASE_TABLET | Freq: Every day | ORAL | 1 refills | Status: AC

## 2024-03-09 NOTE — Patient Instructions (Addendum)
 Medication Instructions:  START Norvasc  (amlodipine ) 5mg  Take 1 tablet once a day  START Potassium 20meqs Take 1 tablet once a day  *If you need a refill on your cardiac medications before your next appointment, please call your pharmacy*  Lab Work: COMPLETE IN 1 WEEK-BMET & MAG (03/15/24) If you have labs (blood work) drawn today and your tests are completely normal, you will receive your results only by: MyChart Message (if you have MyChart) OR A paper copy in the mail If you have any lab test that is abnormal or we need to change your treatment, we will call you to review the results.  Testing/Procedures: [name]- Long Term Monitor Instructions  Your physician has requested you wear a ZIO patch monitor for 7 days.  This is a single patch monitor. [employer] supplies one patch monitor per enrollment. Additional stickers are not available. Please do not apply patch if you will be having a Nuclear Stress Test,  Echocardiogram, Cardiac CT, MRI, or Chest Xray during the period you would be wearing the  monitor. The patch cannot be worn during these tests. You cannot remove and re-apply the  ZIO XT patch monitor.  Your ZIO patch monitor will be mailed 3 day USPS to your address on file. It may take 3-5 days  to receive your monitor after you have been enrolled.  Once you have received your monitor, please review the enclosed instructions. Your monitor  has already been registered assigning a specific monitor serial # to you.  Billing and Patient Assistance Program Information  We have supplied [employer] with any of your insurance information on file for billing purposes. [employer] offers a sliding scale Patient Assistance Program for patients that do not have  insurance, or whose insurance does not completely cover the cost of the ZIO monitor.  You must apply for the Patient Assistance Program to qualify for this discounted rate.  To apply, please call [employer] at [phone], select option 4,  select option 2, ask to apply for  Patient Assistance Program. [name] will ask your household income, and how many people  are in your household. They will quote your out-of-pocket cost based on that information.  [employer] will also be able to set up a 71-month, interest-free payment plan if needed.  Applying the monitor   Shave hair from upper left chest.  Hold abrader disc by orange tab. Rub abrader in 40 strokes over the upper left chest as  indicated in your monitor instructions.  Clean area with 4 enclosed alcohol pads. Let dry.  Apply patch as indicated in monitor instructions. Patch will be placed under collarbone on left  side of chest with arrow pointing upward.  Rub patch adhesive wings for 2 minutes. Remove white label marked 1. Remove the white  label marked 2. Rub patch adhesive wings for 2 additional minutes.  While looking in a mirror, press and release button in center of patch. A small green light will  flash 3-4 times. This will be your only indicator that the monitor has been turned on.  Do not shower for the first 24 hours. You may shower after the first 24 hours.  Press the button if you feel a symptom. You will hear a small click. Record Date, Time and  Symptom in the Patient Logbook.  When you are ready to remove the patch, follow instructions on the last 2 pages of Patient  Logbook. Stick patch monitor onto the last page of Patient Logbook.  Place Patient Logbook  in the blue and white box. Use locking tab on box and tape box closed  securely. The blue and white box has prepaid postage on it. Please place it in the mailbox as  soon as possible. Your physician should have your test results approximately 7 days after the  monitor has been mailed back to [facility].  Call [facility] Customer Care at [phone] if you have questions regarding  your ZIO XT patch monitor. Call them immediately if you see an orange light blinking on your  monitor.  If your  monitor falls off in less than 4 days, contact our Monitor department at [phone].  If your monitor becomes loose or falls off after 4 days call [employer] at [phone] for  suggestions on securing your monitor   Follow-Up: At [facility], you and your health needs are our priority.  As part of our continuing mission to provide you with exceptional heart care, our providers are all part of one team.  This team includes your primary Cardiologist (physician) and Advanced Practice Providers or APPs (Physician Assistants and Nurse Practitioners) who all work together to provide you with the care you need, when you need it.  Your next appointment:   3 month(s)  Provider:   Lonni LITTIE Nanas, MD    We recommend signing up for the patient portal called MyChart.  Sign up information is provided on this After Visit Summary.  MyChart is used to connect with patients for Virtual Visits (Telemedicine).  Patients are able to view lab/test results, encounter notes, upcoming appointments, etc.  Non-urgent messages can be sent to your provider as well.   To learn more about what you can do with MyChart, go to [email or web address].   Other Instructions Please check your blood pressure daily for two weeks, then contact the office with your readings  Please contact the office with your readings either by phone, by dropping it off in person, or by sending it through MyChart.   Be sure to check your blood pressure one to two hours after taking your medications.  Avoid the following for 30 minutes before checking your blood pressure: No caffeine No alcohol No eating No smoking  No exercise  Five minutes before checking your blood pressure: Use the restroom Sit up straight in a chair with your back supported and feet flat on the floor Remain quiet and do not talk

## 2024-03-09 NOTE — Progress Notes (Unsigned)
 Enrolled patient for a 7 day Zio XT monitor to be mailed to patients home.

## 2024-03-16 ENCOUNTER — Ambulatory Visit: Payer: Self-pay | Admitting: Cardiology

## 2024-03-16 DIAGNOSIS — I493 Ventricular premature depolarization: Secondary | ICD-10-CM

## 2024-03-16 LAB — BASIC METABOLIC PANEL WITH GFR
BUN/Creatinine Ratio: 23 (ref 12–28)
BUN: 16 mg/dL (ref 8–27)
CO2: 24 mmol/L (ref 20–29)
Calcium: 9.2 mg/dL (ref 8.7–10.3)
Chloride: 100 mmol/L (ref 96–106)
Creatinine, Ser: 0.69 mg/dL (ref 0.57–1.00)
Glucose: 65 mg/dL — ABNORMAL LOW (ref 70–99)
Potassium: 4 mmol/L (ref 3.5–5.2)
Sodium: 140 mmol/L (ref 134–144)
eGFR: 90 mL/min/1.73

## 2024-03-16 LAB — MAGNESIUM: Magnesium: 2.1 mg/dL (ref 1.6–2.3)

## 2024-03-24 ENCOUNTER — Encounter: Payer: Self-pay | Admitting: Cardiology

## 2024-03-26 DIAGNOSIS — R002 Palpitations: Secondary | ICD-10-CM | POA: Diagnosis not present

## 2024-03-26 DIAGNOSIS — I493 Ventricular premature depolarization: Secondary | ICD-10-CM

## 2024-03-26 NOTE — Telephone Encounter (Signed)
 BP intermittently elevated, would increase amlodipine  to 7.5 mg daily

## 2024-03-29 MED ORDER — AMLODIPINE BESYLATE 5 MG PO TABS: 7.5000 mg | ORAL_TABLET | Freq: Every day | ORAL | 1 refills | Status: AC

## 2024-04-13 ENCOUNTER — Ambulatory Visit: Admitting: Sports Medicine

## 2024-05-04 ENCOUNTER — Ambulatory Visit (HOSPITAL_COMMUNITY)

## 2024-05-04 ENCOUNTER — Ambulatory Visit: Admitting: Sports Medicine

## 2024-05-18 ENCOUNTER — Ambulatory Visit: Admitting: Cardiology
